# Patient Record
Sex: Female | Born: 1985 | Race: White | Hispanic: No | Marital: Married | Smoking: Never smoker
Health system: Southern US, Community
[De-identification: ages and names within clinical notes are randomized; demographics above are authoritative.]

## PROBLEM LIST (undated history)

## (undated) ENCOUNTER — Emergency Department (HOSPITAL_COMMUNITY): Payer: Managed Care, Other (non HMO)

## (undated) DIAGNOSIS — M419 Scoliosis, unspecified: Secondary | ICD-10-CM

## (undated) DIAGNOSIS — F329 Major depressive disorder, single episode, unspecified: Secondary | ICD-10-CM

## (undated) DIAGNOSIS — D649 Anemia, unspecified: Secondary | ICD-10-CM

## (undated) DIAGNOSIS — F32A Depression, unspecified: Secondary | ICD-10-CM

## (undated) HISTORY — PX: BREAST ENHANCEMENT SURGERY: SHX7

## (undated) HISTORY — PX: SPINAL GROWTH RODS: SHX2427

## (undated) HISTORY — PX: WISDOM TOOTH EXTRACTION: SHX21

---
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
## 2008-06-25 ENCOUNTER — Ambulatory Visit: Payer: Self-pay | Admitting: Internal Medicine

## 2010-11-19 LAB — HM PAP SMEAR: HM Pap smear: NORMAL

## 2011-02-24 ENCOUNTER — Ambulatory Visit (INDEPENDENT_AMBULATORY_CARE_PROVIDER_SITE_OTHER): Payer: BC Managed Care – PPO | Admitting: Internal Medicine

## 2011-02-24 ENCOUNTER — Encounter: Payer: Self-pay | Admitting: Internal Medicine

## 2011-02-24 DIAGNOSIS — Z23 Encounter for immunization: Secondary | ICD-10-CM

## 2011-02-24 DIAGNOSIS — J45909 Unspecified asthma, uncomplicated: Secondary | ICD-10-CM | POA: Insufficient documentation

## 2011-02-24 MED ORDER — FLUTICASONE-SALMETEROL 250-50 MCG/DOSE IN AEPB: 1.0000 | INHALATION_SPRAY | Freq: Two times a day (BID) | RESPIRATORY_TRACT | Status: DC

## 2011-02-24 MED ORDER — ALBUTEROL SULFATE HFA 108 (90 BASE) MCG/ACT IN AERS: 2.0000 | INHALATION_SPRAY | Freq: Four times a day (QID) | RESPIRATORY_TRACT | Status: DC | PRN

## 2011-02-24 NOTE — Progress Notes (Signed)
  Subjective:    Patient ID: Lindsey Tucker, female    DOB: 1985-07-03, 25 y.o.   MRN: [id number]  Asthma She complains of cough and wheezing. There is no chest tightness, difficulty breathing, frequent throat clearing, hemoptysis, hoarse voice, shortness of breath or sputum production. This is a chronic problem. The current episode started more than 1 year ago. The problem occurs intermittently. The problem has been unchanged. The cough is non-productive. Pertinent negatives include no appetite change, chest pain, dyspnea on exertion, ear congestion, ear pain, fever, headaches, heartburn, malaise/fatigue, myalgias, nasal congestion, orthopnea, PND, postnasal drip, rhinorrhea, sneezing, sore throat, sweats, trouble swallowing or weight loss. Her symptoms are aggravated by exercise. Her symptoms are alleviated by beta-agonist. She reports significant improvement on treatment. Her past medical history is significant for asthma.      Review of Systems  Constitutional: Negative for fever, chills, weight loss, malaise/fatigue, diaphoresis, activity change, appetite change, fatigue and unexpected weight change.  HENT: Negative.  Negative for ear pain, sore throat, hoarse voice, rhinorrhea, sneezing, trouble swallowing and postnasal drip.   Eyes: Negative.   Respiratory: Positive for cough and wheezing. Negative for apnea, hemoptysis, sputum production, choking, chest tightness, shortness of breath and stridor.   Cardiovascular: Negative for chest pain, dyspnea on exertion, palpitations, leg swelling and PND.  Gastrointestinal: Negative for heartburn, nausea, vomiting, abdominal pain, diarrhea, constipation and blood in stool.  Genitourinary: Negative for dysuria, urgency, frequency, hematuria, decreased urine volume, enuresis, difficulty urinating and dyspareunia.  Musculoskeletal: Negative for myalgias, back pain, joint swelling, arthralgias and gait problem.  Skin: Negative for color change, pallor,  rash and wound.  Neurological: Negative for dizziness, tremors, seizures, syncope, facial asymmetry, speech difficulty, weakness, light-headedness, numbness and headaches.  Hematological: Negative for adenopathy. Does not bruise/bleed easily.  Psychiatric/Behavioral: Negative.        Objective:   Physical Exam  Vitals reviewed. Constitutional: She is oriented to person, place, and time. She appears well-developed and well-nourished. No distress.  HENT:  Mouth/Throat: Oropharynx is clear and moist. No oropharyngeal exudate.  Eyes: Conjunctivae are normal. Right eye exhibits no discharge. Left eye exhibits no discharge. No scleral icterus.  Neck: Normal range of motion. Neck supple. No JVD present. No tracheal deviation present. No thyromegaly present.  Cardiovascular: Normal rate, regular rhythm, normal heart sounds and intact distal pulses.  Exam reveals no gallop and no friction rub.   No murmur heard. Pulmonary/Chest: Effort normal and breath sounds normal. No stridor. No respiratory distress. She has no wheezes. She has no rales. She exhibits no tenderness.  Abdominal: Soft. Bowel sounds are normal. She exhibits no distension and no mass. There is no tenderness. There is no rebound and no guarding.  Musculoskeletal: Normal range of motion. She exhibits no edema and no tenderness.  Lymphadenopathy:    She has no cervical adenopathy.  Neurological: She is oriented to person, place, and time. She displays normal reflexes. She exhibits normal muscle tone.  Skin: Skin is warm and dry. No rash noted. She is not diaphoretic. No erythema. No pallor.  Psychiatric: She has a normal mood and affect. Her behavior is normal. Judgment and thought content normal.          Assessment & Plan:

## 2011-02-24 NOTE — Assessment & Plan Note (Signed)
Restart advair and ventolinHFA

## 2011-02-24 NOTE — Patient Instructions (Signed)
Asthma, Adult Asthma is caused by narrowing of the air passages in the lungs. It may be triggered by pollen, dust, animal dander, molds, some foods, respiratory infections, exposure to smoke, exercise, emotional stress or other allergens (things that cause allergic reactions or allergies). Repeat attacks are common. HOME CARE INSTRUCTIONS  Use prescription medications as ordered by your caregiver.   Avoid pollen, dust, animal dander, molds, smoke and other things that cause attacks at home and at work.   You may have fewer attacks if you decrease dust in your home. Electrostatic air cleaners may help.   It may help to replace your pillows or mattress with materials less likely to cause allergies.   Talk to your caregiver about an action plan for managing asthma attacks at home, including, the use of a peak flow meter which measures the severity of your asthma attack. An action plan can help minimize or stop the attack without having to seek medical care.   If you are not on a fluid restriction, drink 8 to 10 glasses of water each day.   Always have a plan prepared for seeking medical attention, including, calling your physician, accessing local emergency care, and calling 911 (in the [place] for a severe attack.   Discuss possible exercise routines with your caregiver.   If animal dander is the cause of asthma, you may need to get rid of pets.  SEEK MEDICAL CARE IF:  You have wheezing and shortness of breath even if taking medicine to prevent attacks.   An oral temperature above 100.5 develops.   You have muscle aches, chest pain or thickening of sputum.   Your sputum changes from clear or white to yellow, green, gray or bloody.   You have any problems that may be related to the medicine you are taking (such as a rash, itching, swelling or trouble breathing).  SEEK IMMEDIATE MEDICAL CARE IF:  Your usual medicines do not stop your wheezing or there is increased coughing and/or  shortness of breath.   You have increased difficulty breathing.   You have an oral temperature above 100.5, not controlled by medicine.  MAKE SURE YOU:  Understand these instructions.   Will watch your condition.   Will get help right away if you are not doing well or get worse.  Document Released: 06/06/2005 Document Re-Released: 06/28/2009 [employer] Patient Information 2011 [employer]. 

## 2011-04-11 ENCOUNTER — Ambulatory Visit (INDEPENDENT_AMBULATORY_CARE_PROVIDER_SITE_OTHER): Payer: BC Managed Care – PPO | Admitting: Internal Medicine

## 2011-04-11 ENCOUNTER — Encounter: Payer: Self-pay | Admitting: Internal Medicine

## 2011-04-11 VITALS — BP 116/64 | HR 80 | Temp 97.5°F | Resp 16 | Wt 91.0 lb

## 2011-04-11 DIAGNOSIS — J020 Streptococcal pharyngitis: Secondary | ICD-10-CM

## 2011-04-11 MED ORDER — AMOXICILLIN 500 MG PO CAPS: 500.0000 mg | ORAL_CAPSULE | Freq: Three times a day (TID) | ORAL | Status: AC

## 2011-04-11 NOTE — Assessment & Plan Note (Signed)
Start amoxil 

## 2011-04-11 NOTE — Progress Notes (Signed)
Subjective:    Patient ID: Lindsey Tucker, female    DOB: 04/30/1986, 25 y.o.   MRN: [id number]  Sore Throat  This is a new problem. The current episode started in the past 7 days. The problem has been gradually worsening. Neither side of throat is experiencing more pain than the other. There has been no fever. The pain is at a severity of 1/10. The pain is mild. Associated symptoms include coughing and headaches. Pertinent negatives include no abdominal pain, congestion, diarrhea, drooling, ear discharge, ear pain, hoarse voice, plugged ear sensation, neck pain, shortness of breath, stridor, swollen glands, trouble swallowing or vomiting. She has had exposure to strep.  Cough Associated symptoms include chills, headaches and a sore throat. Pertinent negatives include no chest pain, ear pain, fever, myalgias, rash, shortness of breath or wheezing.  Headache  Associated symptoms include coughing and a sore throat. Pertinent negatives include no abdominal pain, back pain, dizziness, ear pain, fever, nausea, neck pain, numbness, seizures, swollen glands, vomiting or weakness.      Review of Systems  Constitutional: Positive for chills. Negative for fever, diaphoresis, activity change, appetite change, fatigue and unexpected weight change.  HENT: Positive for sore throat. Negative for ear pain, congestion, hoarse voice, facial swelling, drooling, mouth sores, trouble swallowing, neck pain, neck stiffness, voice change and ear discharge.   Eyes: Negative.   Respiratory: Positive for cough. Negative for apnea, choking, chest tightness, shortness of breath, wheezing and stridor.   Cardiovascular: Negative for chest pain, palpitations and leg swelling.  Gastrointestinal: Negative for nausea, vomiting, abdominal pain, diarrhea, constipation, abdominal distention and rectal pain.  Genitourinary: Negative for dysuria, urgency, frequency, flank pain, decreased urine volume, enuresis, difficulty urinating  and dyspareunia.  Musculoskeletal: Negative for myalgias, back pain, joint swelling, arthralgias and gait problem.  Skin: Negative for color change, pallor, rash and wound.  Neurological: Positive for headaches. Negative for dizziness, tremors, seizures, syncope, facial asymmetry, speech difficulty, weakness, light-headedness and numbness.  Hematological: Positive for adenopathy (neck).       Objective:   Physical Exam  Vitals reviewed. Constitutional: She is oriented to person, place, and time. She appears well-developed and well-nourished. No distress.  HENT:  Head: Normocephalic and atraumatic. No trismus in the jaw.  Right Ear: Hearing, tympanic membrane, external ear and ear canal normal.  Left Ear: Hearing, external ear and ear canal normal.  Mouth/Throat: Uvula is midline and mucous membranes are normal. Mucous membranes are not pale, not dry and not cyanotic. No uvula swelling. Posterior oropharyngeal erythema present. No oropharyngeal exudate, posterior oropharyngeal edema or tonsillar abscesses.  Eyes: Conjunctivae are normal. Right eye exhibits no discharge. Left eye exhibits no discharge. No scleral icterus.  Neck: Normal range of motion. Neck supple. No JVD present. No tracheal deviation present. No thyromegaly present.  Cardiovascular: Normal rate, regular rhythm, normal heart sounds and intact distal pulses.  Exam reveals no gallop and no friction rub.   No murmur heard. Pulmonary/Chest: Effort normal and breath sounds normal. No stridor. No respiratory distress. She has no wheezes. She has no rales. She exhibits no tenderness.  Abdominal: Soft. Bowel sounds are normal. She exhibits no distension and no mass. There is no tenderness. There is no rebound and no guarding.  Musculoskeletal: Normal range of motion. She exhibits no edema and no tenderness.  Lymphadenopathy:    She has no cervical adenopathy.  Neurological: She is oriented to person, place, and time. She displays  normal reflexes. She exhibits normal muscle tone. Coordination normal.  Skin: Skin is warm and dry. No rash noted. She is not diaphoretic. No erythema. No pallor.  Psychiatric: She has a normal mood and affect. Her behavior is normal. Judgment and thought content normal.          Assessment & Plan:

## 2011-04-11 NOTE — Patient Instructions (Signed)
Strep Throat     Strep throat is an infection of the throat caused by a bacteria named Streptococcus pyogenes. Your caregiver may call the infection streptococcal "tonsillitis" or "pharyngitis" depending on whether there are signs of inflammation in the tonsils or back of the throat. Strep throat is most common in children from 5 to 25 years old during the cold months of the year, but it can occur in people of any age during any season. This infection is spread from person to person (contagious) through coughing, sneezing, or other close contact.  SYMPTOMS   · Fever or chills.   · Painful, swollen, red tonsils or throat.   · Pain or difficulty when swallowing.   · White or yellow spots on the tonsils or throat.   · Swollen, tender lymph nodes or "glands" of the neck or under the jaw.   · Red rash all over the body (rare).   DIAGNOSIS   Many different infections can cause the same symptoms. A test must be done to confirm the diagnosis so the right treatment can be given. A "rapid strep test" can help your caregiver make the diagnosis in a few minutes. If this test is not available, a light swab of the infected area can be used for a throat culture test. If a throat culture test is done, results are usually available in a day or two.  TREATMENT   Strep throat is treated with antibiotic medicine.  HOME CARE INSTRUCTIONS   · Gargle with 1 tsp of salt in 1 cup of warm water, 3 to 4 times per day or as needed for comfort.   · Family members who also have a sore throat or fever should be tested for strep throat and treated with antibiotics if they have the strep infection.   · Make sure everyone in your household washes their hands well.   · Do not share food, drinking cups, or personal items that could cause the infection to spread to others.   · You may need to eat a soft food diet until your sore throat gets better.   · Drink enough water and fluids to keep your urine clear or pale yellow. This will help prevent  dehydration.   · Get plenty of rest.   · Stay home from school, daycare, or work until you have been on antibiotics for 24 hours.   · Only take over-the-counter or prescription medicines for pain, discomfort, or fever as directed by your caregiver.   · If antibiotics are prescribed, take them as directed. Finish them even if you start to feel better.   SEEK MEDICAL CARE IF:   · The glands in your neck continue to enlarge.   · You develop a rash, cough, or earache.   · You cough up green, yellow-brown, or bloody sputum.   · You have pain or discomfort not controlled by medicines.   · Your problems seem to be getting worse rather than better.   SEEK IMMEDIATE MEDICAL CARE IF:   · You develop any new symptoms such as vomiting, severe headache, stiff or painful neck, chest pain, shortness of breath, or trouble swallowing.   · You develop severe throat pain, drooling, or changes in your voice.   · You develop swelling of the neck, or the skin on the neck becomes red and tender.   · You have a fever.   · You develop signs of dehydration, such as fatigue, dry mouth, and decreased urination.   · 

## 2011-06-09 ENCOUNTER — Ambulatory Visit (INDEPENDENT_AMBULATORY_CARE_PROVIDER_SITE_OTHER): Payer: BC Managed Care – PPO | Admitting: Internal Medicine

## 2011-06-09 ENCOUNTER — Encounter: Payer: Self-pay | Admitting: Internal Medicine

## 2011-06-09 VITALS — BP 100/62 | HR 88 | Temp 98.2°F | Ht 60.0 in | Wt 89.0 lb

## 2011-06-09 DIAGNOSIS — J45901 Unspecified asthma with (acute) exacerbation: Secondary | ICD-10-CM | POA: Insufficient documentation

## 2011-06-09 DIAGNOSIS — J209 Acute bronchitis, unspecified: Secondary | ICD-10-CM

## 2011-06-09 MED ORDER — AZITHROMYCIN 250 MG PO TABS: ORAL_TABLET | ORAL | Status: AC

## 2011-06-09 MED ORDER — PREDNISONE 10 MG PO TABS: ORAL_TABLET | ORAL | Status: DC

## 2011-06-09 MED ORDER — HYDROCODONE-HOMATROPINE 5-1.5 MG/5ML PO SYRP: 5.0000 mL | ORAL_SOLUTION | Freq: Four times a day (QID) | ORAL | Status: AC | PRN

## 2011-06-09 NOTE — Patient Instructions (Signed)
Take all new medications as prescribed Continue all other medications as before You can also take 1-2 puffs of the pulmicort twice per day to help with the wheezing if you do not want to take the prednisone

## 2011-06-09 NOTE — Assessment & Plan Note (Signed)
Mild, for predpack asd though she is reluctant, so as an alternative gave pulmicort flexhaler sample to use at 2 puffs bid, Continue all other medications as before

## 2011-06-09 NOTE — Progress Notes (Signed)
  Subjective:    Patient ID: Lindsey Tucker, female    DOB: 15-May-1986, 25 y.o.   MRN: [id number]  HPI  Here with acute onset mild to mod 2-3 days ST, HA, general weakness and malaise, with prod cough greenish sputum, but Pt denies chest pain, increased sob or doe, wheezing, orthopnea, PND, increased LE swelling, palpitations, dizziness or syncope, until mild wheezing onset today with mild sob, but improved with inhaler.   Past Medical History  Diagnosis Date  . Asthma    No past surgical history on file.  reports that she has never smoked. She has never used smokeless tobacco. She reports that she drinks about 1.2 ounces of alcohol per week. She reports that she does not use illicit drugs. family history includes Hypertension in her father and mother.  There is no history of Cancer. No Known Allergies Current Outpatient Prescriptions on File Prior to Visit  Medication Sig Dispense Refill  . albuterol (PROVENTIL HFA;VENTOLIN HFA) 108 (90 BASE) MCG/ACT inhaler Inhale 2 puffs into the lungs every 6 (six) hours as needed for wheezing.  2 Inhaler  0  . Fluticasone-Salmeterol (ADVAIR DISKUS) 250-50 MCG/DOSE AEPB Inhale 1 puff into the lungs 2 (two) times daily.  60 each  11   Review of Systems All otherwise neg per pt     Objective:   Physical Exam BP 100/62  Pulse 88  Temp(Src) 98.2 F (36.8 C) (Oral)  Ht 5' (1.524 m)  Wt 89 lb (40.37 kg)  BMI 17.38 kg/m2  SpO2 97%  LMP 06/05/2011 Physical Exam  VS noted, mild ill Constitutional: Pt appears well-developed and well-nourished.  HENT: Head: Normocephalic.  Right Ear: External ear normal.  Left Ear: External ear normal.  Bilat tm's mild erythema.  Sinus nontender.  Pharynx mild erythema Eyes: Conjunctivae and EOM are normal. Pupils are equal, round, and reactive to light.  Neck: Normal range of motion. Neck supple.  Cardiovascular: Normal rate and regular rhythm.   Pulmonary/Chest: Effort normal and breath sounds decresed with bilat  mild wheeze, no rales  Skin: Skin is warm. No erythema.  Psychiatric: Pt behavior is normal. Thought content normal.     Assessment & Plan:

## 2011-06-09 NOTE — Assessment & Plan Note (Signed)
Mild to mod, for antibx course,  to f/u any worsening symptoms or concerns 

## 2011-10-14 ENCOUNTER — Encounter: Payer: Self-pay | Admitting: Internal Medicine

## 2011-10-14 ENCOUNTER — Ambulatory Visit (INDEPENDENT_AMBULATORY_CARE_PROVIDER_SITE_OTHER): Payer: BC Managed Care – PPO | Admitting: Internal Medicine

## 2011-10-14 ENCOUNTER — Telehealth: Payer: Self-pay

## 2011-10-14 VITALS — BP 118/74 | HR 102 | Temp 97.2°F | Resp 16 | Wt 95.0 lb

## 2011-10-14 DIAGNOSIS — J069 Acute upper respiratory infection, unspecified: Secondary | ICD-10-CM

## 2011-10-14 DIAGNOSIS — J45901 Unspecified asthma with (acute) exacerbation: Secondary | ICD-10-CM

## 2011-10-14 DIAGNOSIS — B9689 Other specified bacterial agents as the cause of diseases classified elsewhere: Secondary | ICD-10-CM | POA: Insufficient documentation

## 2011-10-14 MED ORDER — DEXAMETHASONE 1.5 MG PO KIT: 1.0000 | PACK | Freq: Every day | ORAL | Status: DC

## 2011-10-14 MED ORDER — METHYLPREDNISOLONE 4 MG PO KIT: PACK | ORAL | Status: AC

## 2011-10-14 NOTE — Assessment & Plan Note (Signed)
She will continue the current inhalers and will start a steroid pak

## 2011-10-14 NOTE — Assessment & Plan Note (Signed)
She has no s/s that are suggestive of a bacterial infection so no antibiotics were prescribed

## 2011-10-14 NOTE — Telephone Encounter (Signed)
Lindsey Tucker with [employer] called [facility] stating that dex pk is not available and pt would like alternative.

## 2011-10-14 NOTE — Progress Notes (Signed)
Subjective:    Patient ID: Lindsey Tucker, female    DOB: 07-17-1985, 26 y.o.   MRN: [id number]  URI  This is a new problem. The current episode started yesterday. The problem has been unchanged. There has been no fever. Associated symptoms include coughing (non-productive), a sore throat and wheezing. Pertinent negatives include no abdominal pain, chest pain, congestion, diarrhea, dysuria, ear pain, headaches, joint pain, joint swelling, nausea, neck pain, plugged ear sensation, rash, rhinorrhea, sinus pain, sneezing, swollen glands or vomiting. She has tried nothing for the symptoms.  Asthma She complains of chest tightness, cough (non-productive) and wheezing. There is no difficulty breathing, frequent throat clearing, hemoptysis, hoarse voice, shortness of breath or sputum production. This is a chronic problem. The current episode started more than 1 year ago. The problem occurs intermittently. The problem has been unchanged. The cough is non-productive. Associated symptoms include a sore throat. Pertinent negatives include no appetite change, chest pain, dyspnea on exertion, ear congestion, ear pain, fever, headaches, heartburn, malaise/fatigue, myalgias, nasal congestion, orthopnea, PND, postnasal drip, rhinorrhea, sneezing, sweats, trouble swallowing or weight loss. Her symptoms are aggravated by URI. Her symptoms are alleviated by beta-agonist and steroid inhaler. She reports minimal improvement on treatment. Her past medical history is significant for asthma.      Review of Systems  Constitutional: Negative for fever, chills, weight loss, malaise/fatigue, diaphoresis, activity change, appetite change, fatigue and unexpected weight change.  HENT: Positive for sore throat. Negative for ear pain, congestion, hoarse voice, rhinorrhea, sneezing, trouble swallowing, neck pain and postnasal drip.   Eyes: Negative.   Respiratory: Positive for cough (non-productive) and wheezing. Negative for apnea,  hemoptysis, sputum production, choking, chest tightness, shortness of breath and stridor.   Cardiovascular: Negative for chest pain, dyspnea on exertion, palpitations, leg swelling and PND.  Gastrointestinal: Negative.  Negative for heartburn, nausea, vomiting, abdominal pain and diarrhea.  Genitourinary: Negative.  Negative for dysuria.  Musculoskeletal: Negative.  Negative for myalgias and joint pain.  Skin: Negative.  Negative for rash.  Neurological: Negative.  Negative for headaches.  Hematological: Negative for adenopathy. Does not bruise/bleed easily.  Psychiatric/Behavioral: Negative.        Objective:   Physical Exam  Vitals reviewed. Constitutional: She is oriented to person, place, and time. She appears well-developed and well-nourished. No distress.  HENT:  Head: Normocephalic and atraumatic.  Mouth/Throat: Oropharynx is clear and moist. No oropharyngeal exudate.  Eyes: Conjunctivae are normal. Right eye exhibits no discharge. Left eye exhibits no discharge. No scleral icterus.  Neck: Normal range of motion. Neck supple. No JVD present. No tracheal deviation present. No thyromegaly present.  Cardiovascular: Normal rate, regular rhythm, normal heart sounds and intact distal pulses.  Exam reveals no gallop and no friction rub.   No murmur heard. Pulmonary/Chest: Effort normal. No accessory muscle usage or stridor. Not tachypneic. No respiratory distress. She has no decreased breath sounds. She has wheezes in the right middle field and the left middle field. She has rhonchi in the right middle field and the left middle field. She has no rales. She exhibits no tenderness.  Abdominal: Soft. Bowel sounds are normal. She exhibits no distension and no mass. There is no tenderness. There is no rebound and no guarding.  Musculoskeletal: Normal range of motion. She exhibits no edema and no tenderness.  Lymphadenopathy:    She has no cervical adenopathy.  Neurological: She is oriented to  person, place, and time.  Skin: Skin is warm and dry. No rash noted. She  is not diaphoretic. No erythema. No pallor.  Psychiatric: She has a normal mood and affect. Her behavior is normal. Judgment and thought content normal.          Assessment & Plan:

## 2011-10-14 NOTE — Patient Instructions (Signed)
Upper Respiratory Infection, Adult  An upper respiratory infection (URI) is also known as the common cold. It is often caused by a type of germ (virus). Colds are easily spread (contagious). You can pass it to others by kissing, coughing, sneezing, or drinking out of the same glass. Usually, you get better in 1 or 2 weeks.   HOME CARE    Only take medicine as told by your doctor.   Use a warm mist humidifier or breathe in steam from a hot shower.   Drink enough water and fluids to keep your pee (urine) clear or pale yellow.   Get plenty of rest.   Return to work when your temperature is back to normal or as told by your doctor. You may use a face mask and wash your hands to stop your cold from spreading.  GET HELP RIGHT AWAY IF:    After the first few days, you feel you are getting worse.   You have questions about your medicine.   You have chills, shortness of breath, or brown or red spit (mucus).   You have yellow or brown snot (nasal discharge) or pain in the face, especially when you bend forward.   You have a fever, puffy (swollen) neck, pain when you swallow, or white spots in the back of your throat.   You have a bad headache, ear pain, sinus pain, or chest pain.   You have a high-pitched whistling sound when you breathe in and out (wheezing).   You have a lasting cough or cough up blood.   You have sore muscles or a stiff neck.  MAKE SURE YOU:    Understand these instructions.   Will watch your condition.   Will get help right away if you are not doing well or get worse.  Document Released: 11/23/2007 Document Revised: 05/26/2011 Document Reviewed: 10/11/2010  Exit[employer] Patient Information 2012 [employer]  Asthma, Adult  Asthma is caused by narrowing of the air passages in the lungs. It may be triggered by pollen, dust, animal dander, molds, some foods, respiratory infections, exposure to smoke, exercise, emotional stress or other allergens (things that cause allergic reactions or  allergies). Repeat attacks are common.  HOME CARE INSTRUCTIONS    Use prescription medications as ordered by your caregiver.   Avoid pollen, dust, animal dander, molds, smoke and other things that cause attacks at home and at work.   You may have fewer attacks if you decrease dust in your home. Electrostatic air cleaners may help.   It may help to replace your pillows or mattress with materials less likely to cause allergies.   Talk to your caregiver about an action plan for managing asthma attacks at home, including, the use of a peak flow meter which measures the severity of your asthma attack. An action plan can help minimize or stop the attack without having to seek medical care.   If you are not on a fluid restriction, drink 8 to 10 glasses of water each day.   Always have a plan prepared for seeking medical attention, including, calling your physician, accessing local emergency care, and calling 911 (in the [place] for a severe attack.   Discuss possible exercise routines with your caregiver.   If animal dander is the cause of asthma, you may need to get rid of pets.  SEEK MEDICAL CARE IF:    You have wheezing and shortness of breath even if taking medicine to prevent attacks.   You have muscle   aches, chest pain or thickening of sputum.   Your sputum changes from clear or white to yellow, green, gray, or bloody.   You have any problems that may be related to the medicine you are taking (such as a rash, itching, swelling or trouble breathing).  SEEK IMMEDIATE MEDICAL CARE IF:    Your usual medicines do not stop your wheezing or there is increased coughing and/or shortness of breath.   You have increased difficulty breathing.   You have a fever.  MAKE SURE YOU:    Understand these instructions.   Will watch your condition.   Will get help right away if you are not doing well or get worse.  Document Released: 06/06/2005 Document Revised: 05/26/2011 Document Reviewed: 01/23/2008  [employer] Patient  Information 2012 [employer].

## 2012-06-20 NOTE — L&D Delivery Note (Signed)
Operative Delivery Note At 10:14 PM a viable and healthy female was delivered via Vaginal, [employer], operational).  Presentation: vertex; Position: Right,, Occiput,, Anterior; Station: +4.  Verbal consent: obtained from patient.  Risks and benefits discussed in detail.  Risks include, but are not limited to the risks of anesthesia, bleeding, infection, damage to maternal tissues, fetal cephalhematoma.  There is also the risk of inability to effect vaginal delivery of the head, or shoulder dystocia that cannot be resolved by established maneuvers, leading to the need for emergency cesarean section.  APGAR: 9,9 ; weight pending.   Placenta status: Intact, Spontaneous.   Cord: 2 vessels with the following complications: .  Cord pH: na  Anesthesia: Epidural  Instruments: Kiwi x 2 pulls, one pop off Episiotomy: None Lacerations: second Suture Repair: 2.0 3.0 vicryl rapide Est. Blood Loss (mL): 300  Mom to postpartum.  Baby to Couplet care / Skin to Skin.  Keitra Carusone J 05/31/2013, 10:31 PM

## 2012-07-16 ENCOUNTER — Ambulatory Visit (INDEPENDENT_AMBULATORY_CARE_PROVIDER_SITE_OTHER): Payer: BC Managed Care – PPO | Admitting: Internal Medicine

## 2012-07-16 ENCOUNTER — Encounter: Payer: Self-pay | Admitting: Internal Medicine

## 2012-07-16 VITALS — BP 100/66 | HR 84 | Temp 98.4°F | Resp 18 | Wt 95.0 lb

## 2012-07-16 DIAGNOSIS — B9689 Other specified bacterial agents as the cause of diseases classified elsewhere: Secondary | ICD-10-CM

## 2012-07-16 DIAGNOSIS — J329 Chronic sinusitis, unspecified: Secondary | ICD-10-CM

## 2012-07-16 DIAGNOSIS — A499 Bacterial infection, unspecified: Secondary | ICD-10-CM

## 2012-07-16 MED ORDER — AMOXICILLIN 875 MG PO TABS: 875.0000 mg | ORAL_TABLET | Freq: Two times a day (BID) | ORAL | Status: DC

## 2012-07-16 NOTE — Progress Notes (Signed)
Subjective:    Patient ID: Lindsey Tucker, female    DOB: 1986/06/08, 27 y.o.   MRN: [id number]  Sinusitis This is a new problem. The current episode started 1 to 4 weeks ago. The problem has been gradually worsening since onset. There has been no fever. The fever has been present for less than 1 day. Her pain is at a severity of 0/10. She is experiencing no pain. Associated symptoms include congestion, coughing (NP), headaches, sinus pressure and a sore throat. Pertinent negatives include no chills, diaphoresis, ear pain, hoarse voice, neck pain, shortness of breath, sneezing or swollen glands. Past treatments include oral decongestants. The treatment provided mild relief.      Review of Systems  Constitutional: Negative for fever, chills, diaphoresis, appetite change, fatigue and unexpected weight change.  HENT: Positive for congestion, sore throat, rhinorrhea and sinus pressure. Negative for ear pain, hoarse voice, facial swelling, sneezing, trouble swallowing, neck pain, neck stiffness, dental problem, voice change, postnasal drip and ear discharge.   Eyes: Negative.   Respiratory: Positive for cough (NP). Negative for apnea, choking, chest tightness, shortness of breath, wheezing and stridor.   Cardiovascular: Negative for chest pain, palpitations and leg swelling.  Gastrointestinal: Negative for nausea, vomiting, abdominal pain, diarrhea, constipation and blood in stool.  Genitourinary: Negative.   Musculoskeletal: Negative for myalgias, back pain, joint swelling, arthralgias and gait problem.  Skin: Negative for color change, pallor, rash and wound.  Neurological: Positive for headaches. Negative for dizziness, weakness and light-headedness.  Hematological: Negative for adenopathy. Does not bruise/bleed easily.  Psychiatric/Behavioral: Negative.        Objective:   Physical Exam  Vitals reviewed. Constitutional: She is oriented to person, place, and time. She appears  well-developed and well-nourished.  Non-toxic appearance. She does not have a sickly appearance. She does not appear ill. No distress.  HENT:  Head: Normocephalic and atraumatic. No trismus in the jaw.  Right Ear: Hearing, tympanic membrane, external ear and ear canal normal.  Left Ear: Hearing, tympanic membrane, external ear and ear canal normal.  Nose: Mucosal edema and rhinorrhea present. No sinus tenderness. No epistaxis. Right sinus exhibits maxillary sinus tenderness. Right sinus exhibits no frontal sinus tenderness. Left sinus exhibits maxillary sinus tenderness. Left sinus exhibits no frontal sinus tenderness.  Mouth/Throat: Oropharynx is clear and moist and mucous membranes are normal. Mucous membranes are not pale, not dry and not cyanotic. No oral lesions. No uvula swelling. No oropharyngeal exudate, posterior oropharyngeal edema or tonsillar abscesses.  Eyes: Conjunctivae normal are normal. Right eye exhibits no discharge. Left eye exhibits no discharge. No scleral icterus.  Neck: Normal range of motion. Neck supple. No JVD present. No tracheal deviation present. No thyromegaly present.  Cardiovascular: Normal rate, regular rhythm, normal heart sounds and intact distal pulses.  Exam reveals no gallop and no friction rub.   No murmur heard. Pulmonary/Chest: Effort normal and breath sounds normal. No stridor. No respiratory distress. She has no wheezes. She has no rales. She exhibits no tenderness.  Abdominal: Soft. Bowel sounds are normal. She exhibits no distension and no mass. There is no tenderness. There is no rebound and no guarding.  Musculoskeletal: Normal range of motion. She exhibits no edema and no tenderness.  Lymphadenopathy:    She has no cervical adenopathy.  Neurological: She is oriented to person, place, and time.  Skin: Skin is warm and dry. No rash noted. She is not diaphoretic. No erythema. No pallor.  Psychiatric: She has a normal mood and affect.  Her behavior is  normal. Judgment and thought content normal.          Assessment & Plan:

## 2012-07-16 NOTE — Patient Instructions (Signed)

## 2012-07-16 NOTE — Assessment & Plan Note (Signed)
Will start amoxil for the infection

## 2012-09-05 ENCOUNTER — Other Ambulatory Visit: Payer: BC Managed Care – PPO

## 2012-09-05 ENCOUNTER — Ambulatory Visit (INDEPENDENT_AMBULATORY_CARE_PROVIDER_SITE_OTHER): Payer: BC Managed Care – PPO | Admitting: Internal Medicine

## 2012-09-05 ENCOUNTER — Encounter: Payer: Self-pay | Admitting: Internal Medicine

## 2012-09-05 VITALS — BP 110/72 | HR 136 | Temp 100.2°F

## 2012-09-05 DIAGNOSIS — R509 Fever, unspecified: Secondary | ICD-10-CM

## 2012-09-05 DIAGNOSIS — J029 Acute pharyngitis, unspecified: Secondary | ICD-10-CM

## 2012-09-05 DIAGNOSIS — R590 Localized enlarged lymph nodes: Secondary | ICD-10-CM

## 2012-09-05 DIAGNOSIS — R599 Enlarged lymph nodes, unspecified: Secondary | ICD-10-CM

## 2012-09-05 MED ORDER — ACETAMINOPHEN 500 MG PO TABS: 500.0000 mg | ORAL_TABLET | Freq: Four times a day (QID) | ORAL | Status: DC | PRN

## 2012-09-05 NOTE — Patient Instructions (Signed)
It was good to see you today. We have reviewed your prior records including labs and tests today Rapid strep test today negative Will send for throat culture to confirm these results Also check blood for possible mono - Test(s) ordered today. Your results will be released to MyChart (or called to you) after review, usually within 72hours after test completion. If any changes need to be made, you will be notified at that same time. Until results return, no indication for antibiotics at this time Use Tylenol 500 mg every 6 hours as needed for fever or pain symptoms Salt water gargle as needed for sore throat, see directions below Call if symptoms unimproved in next 2 weeks, sooner if worse Viral and Bacterial Pharyngitis Pharyngitis is soreness (inflammation) or infection of the pharynx. It is also called a sore throat. CAUSES  Most sore throats are caused by viruses and are part of a cold. However, some sore throats are caused by strep and other bacteria. Sore throats can also be caused by post nasal drip from draining sinuses, allergies and sometimes from sleeping with an open mouth. Infectious sore throats can be spread from person to person by coughing, sneezing and sharing cups or eating utensils. TREATMENT  Sore throats that are viral usually last 3-4 days. Viral illness will get better without medications (antibiotics). Strep throat and other bacterial infections will usually begin to get better about 24-48 hours after you begin to take antibiotics. HOME CARE INSTRUCTIONS   If the caregiver feels there is a bacterial infection or if there is a positive strep test, they will prescribe an antibiotic. The full course of antibiotics must be taken. If the full course of antibiotic is not taken, you or your child may become ill again. If you or your child has strep throat and do not finish all of the medication, serious heart or kidney diseases may develop.  Drink enough water and fluids to keep  your urine clear or pale yellow.  Only take over-the-counter or prescription medicines for pain, discomfort or fever as directed by your caregiver.  Get lots of rest.  Gargle with salt water ( tsp. of salt in a glass of water) as often as every 1-2 hours as you need for comfort.  Hard candies may soothe the throat if individual is not at risk for choking. Throat sprays or lozenges may also be used. SEEK MEDICAL CARE IF:   Large, tender lumps in the neck develop.  A rash develops.  Green, yellow-brown or bloody sputum is coughed up.  Your baby is older than 3 months with a rectal temperature of 100.5 F (38.1 C) or higher for more than 1 day. SEEK IMMEDIATE MEDICAL CARE IF:   A stiff neck develops.  You or your child are drooling or unable to swallow liquids.  You or your child are vomiting, unable to keep medications or liquids down.  You or your child has severe pain, unrelieved with recommended medications.  You or your child are having difficulty breathing (not due to stuffy nose).  You or your child are unable to fully open your mouth.  You or your child develop redness, swelling, or severe pain anywhere on the neck.  You have a fever.  Your baby is older than 3 months with a rectal temperature of 102 F (38.9 C) or higher.  Your baby is 82 months old or younger with a rectal temperature of 100.4 F (38 C) or higher. MAKE SURE YOU:   Understand these  instructions.  Will watch your condition.  Will get help right away if you are not doing well or get worse. Document Released: 06/06/2005 Document Revised: 08/29/2011 Document Reviewed: 09/03/2007 [facility] Patient Information 2013 [place], [place]. Salt Water Gargle This solution will help make your mouth and throat feel better. HOME CARE INSTRUCTIONS   Mix 1 teaspoon of salt in 8 ounces of warm water.  Gargle with this solution as much or often as you need or as directed. Swish and gargle gently if you have  any sores or wounds in your mouth.  Do not swallow this mixture. Document Released: 03/10/2004 Document Revised: 08/29/2011 Document Reviewed: 08/01/2008 [facility] Patient Information 2013 [place], [place].

## 2012-09-05 NOTE — Progress Notes (Signed)
  Subjective:    Patient ID: Lindsey Tucker, female    DOB: [birth year], 27 y.o.   MRN: [id number]  Sore Throat  This is a new problem. The current episode started today. The problem has been rapidly worsening. The pain is worse on the right side. The maximum temperature recorded prior to her arrival was 101 - 101.9 F. The fever has been present for less than 1 day. The pain is moderate. Associated symptoms include trouble swallowing. Pertinent negatives include no abdominal pain, coughing, diarrhea, drooling, ear discharge, ear pain, headaches, hoarse voice, plugged ear sensation, shortness of breath, stridor, swollen glands or vomiting. She has tried nothing for the symptoms.  Fever  Pertinent negatives include no abdominal pain, coughing, diarrhea, ear pain, headaches or vomiting.   Past Medical History  Diagnosis Date  . Asthma      Review of Systems  Constitutional: Positive for fever.  HENT: Positive for trouble swallowing. Negative for ear pain, hoarse voice, drooling and ear discharge.   Respiratory: Negative for cough, shortness of breath and stridor.   Gastrointestinal: Negative for vomiting, abdominal pain and diarrhea.  Neurological: Negative for headaches.       Objective:   Physical Exam BP 110/72  Pulse 136  Temp(Src) 100.2 F (37.9 C) (Oral)  SpO2 98% Wt Readings from Last 3 Encounters:  07/16/12 95 lb (43.092 kg)  10/14/11 95 lb (43.092 kg)  06/09/11 89 lb (40.37 kg)   Constitutional: She appears well-developed and well-nourished. No distress. Tired but nontoxic HENT: Head: Normocephalic and atraumatic. Ears: B TMs ok, no erythema or effusion; Nose: Nose normal. Mouth/Throat: Oropharynx is red but clear and moist. No oropharyngeal exudate.  Eyes: Conjunctivae and EOM are normal. Pupils are equal, round, and reactive to light. No scleral icterus.  Neck: Normal range of motion. Neck supple. +LAD on R. No JVD present. No thyromegaly present.  Cardiovascular: Normal  rate, regular rhythm and normal heart sounds.  No murmur heard. No BLE edema. Pulmonary/Chest: Effort normal and breath sounds normal. No respiratory distress. She has no wheezes. Skin: Skin is warm and dry. No rash noted. No erythema.  Psychiatric: She has a normal mood and affect. Her behavior is normal. Judgment and thought content normal.   No results found for this basename: WBC, HGB, HCT, PLT, GLUCOSE, CHOL, TRIG, HDL, LDLDIRECT, LDLCALC, ALT, AST, NA, K, CL, CREATININE, BUN, CO2, TSH, PSA, INR, GLUF, HGBA1C, MICROALBUR       Assessment & Plan:   Acute pharyngitis -  Associated with fever, cervical lymphadenopathy and malaise Asthma, stable without flare Also reports in process of trying to become pregnant  Check rapid strep today and sent for culture to confirm/deny: Check labs for acute mono Hold empiric antibiotics unless positive culture for strep Symptomatic care advised: saline gargle and Tylenol when necessary

## 2012-09-06 ENCOUNTER — Other Ambulatory Visit: Payer: Self-pay | Admitting: Internal Medicine

## 2012-09-06 ENCOUNTER — Telehealth: Payer: Self-pay | Admitting: *Deleted

## 2012-09-06 ENCOUNTER — Encounter: Payer: Self-pay | Admitting: Internal Medicine

## 2012-09-06 LAB — EPSTEIN-BARR VIRUS VCA ANTIBODY PANEL: EBV EA IgG: <5 U/mL (ref <9.0)

## 2012-09-06 MED ORDER — AMOXICILLIN 500 MG PO CAPS: 500.0000 mg | ORAL_CAPSULE | Freq: Three times a day (TID) | ORAL | Status: DC

## 2012-09-06 NOTE — Telephone Encounter (Signed)
The results of culture are not back yet - this usually takes 48-72h - However, it is ok to start amoxicillin antibiotics pending these results: amox 500 TID x 10d - erx done (safe in pregnancy, will call to stop if cx negative) Continue tylenol 500mg  q6h - thanks

## 2012-09-06 NOTE — Telephone Encounter (Signed)
Pt requesting results of lab work and throat culture from yesterday, she states that her swelling in lymph notes and throat pain has increased and now she has white spots on tonsils.

## 2012-09-07 NOTE — Telephone Encounter (Signed)
Pt informed of new rx and MD's advisement.  

## 2012-09-08 LAB — CULTURE, GROUP A STREP

## 2013-04-25 ENCOUNTER — Other Ambulatory Visit: Payer: Self-pay

## 2013-05-03 ENCOUNTER — Ambulatory Visit (INDEPENDENT_AMBULATORY_CARE_PROVIDER_SITE_OTHER): Payer: BC Managed Care – PPO | Admitting: Internal Medicine

## 2013-05-03 ENCOUNTER — Ambulatory Visit: Payer: BC Managed Care – PPO | Admitting: Internal Medicine

## 2013-05-03 ENCOUNTER — Encounter: Payer: Self-pay | Admitting: Internal Medicine

## 2013-05-03 VITALS — BP 118/80 | HR 94 | Temp 98.6°F | Resp 16 | Wt 119.0 lb

## 2013-05-03 DIAGNOSIS — J069 Acute upper respiratory infection, unspecified: Secondary | ICD-10-CM | POA: Insufficient documentation

## 2013-05-03 NOTE — Progress Notes (Signed)
Pre-visit discussion using our clinic review tool. No additional management support is needed unless otherwise documented below in the visit note.  

## 2013-05-03 NOTE — Assessment & Plan Note (Signed)
This is a viral URI Antibiotics are not indicated She will treat with OTC symptom relievers

## 2013-05-03 NOTE — Progress Notes (Signed)
Subjective:    Patient ID: Lindsey Tucker, female    DOB: 04/18/1986, 27 y.o.   MRN: [id number]  URI  This is a new problem. The current episode started in the past 7 days. The problem has been unchanged. There has been no fever. The fever has been present for less than 1 day. Associated symptoms include coughing (nonproductive) and a sore throat. Pertinent negatives include no abdominal pain, chest pain, congestion, diarrhea, dysuria, ear pain, headaches, joint pain, joint swelling, nausea, neck pain, plugged ear sensation, rash, rhinorrhea, sinus pain, sneezing, swollen glands, vomiting or wheezing. She has tried nothing for the symptoms. The treatment provided mild relief.      Review of Systems  Constitutional: Negative.  Negative for fever, chills, diaphoresis, activity change, appetite change, fatigue and unexpected weight change.  HENT: Positive for sore throat. Negative for congestion, ear pain, nosebleeds, postnasal drip, rhinorrhea, sneezing, tinnitus, trouble swallowing and voice change.   Eyes: Negative.   Respiratory: Positive for cough (nonproductive). Negative for apnea, choking, chest tightness, shortness of breath, wheezing and stridor.   Cardiovascular: Negative.  Negative for chest pain, palpitations and leg swelling.  Gastrointestinal: Negative.  Negative for nausea, vomiting, abdominal pain and diarrhea.  Endocrine: Negative.   Genitourinary: Negative.  Negative for dysuria.  Musculoskeletal: Negative.  Negative for joint pain and neck pain.  Skin: Negative.  Negative for color change, pallor, rash and wound.  Allergic/Immunologic: Negative.   Neurological: Negative.  Negative for headaches.  Hematological: Negative.  Negative for adenopathy. Does not bruise/bleed easily.  Psychiatric/Behavioral: Negative.        Objective:   Physical Exam  Vitals reviewed. Constitutional: She is oriented to person, place, and time. She appears well-developed and well-nourished.   Non-toxic appearance. She does not have a sickly appearance. She does not appear ill. No distress.  HENT:  Head: Normocephalic and atraumatic.  Right Ear: Hearing, tympanic membrane, external ear and ear canal normal.  Left Ear: Hearing, tympanic membrane, external ear and ear canal normal.  Nose: Right sinus exhibits no maxillary sinus tenderness and no frontal sinus tenderness. Left sinus exhibits no maxillary sinus tenderness and no frontal sinus tenderness.  Mouth/Throat: Oropharynx is clear and moist and mucous membranes are normal. Mucous membranes are not pale, not dry and not cyanotic. No oral lesions. No trismus in the jaw. No uvula swelling. No oropharyngeal exudate, posterior oropharyngeal edema, posterior oropharyngeal erythema or tonsillar abscesses.  Eyes: Conjunctivae are normal. Right eye exhibits no discharge. Left eye exhibits no discharge. No scleral icterus.  Neck: Normal range of motion. Neck supple. No JVD present. No tracheal deviation present. No thyromegaly present.  Cardiovascular: Normal rate, regular rhythm, normal heart sounds and intact distal pulses.  Exam reveals no gallop and no friction rub.   No murmur heard. Pulmonary/Chest: Effort normal and breath sounds normal. No stridor. No respiratory distress. She has no wheezes. She has no rales. She exhibits no tenderness.  Abdominal: Soft. Bowel sounds are normal. She exhibits no distension and no mass. There is no tenderness. There is no rebound and no guarding.  Musculoskeletal: Normal range of motion. She exhibits no edema and no tenderness.  Lymphadenopathy:    She has no cervical adenopathy.  Neurological: She is oriented to person, place, and time.  Skin: Skin is warm and dry. No rash noted. She is not diaphoretic. No erythema. No pallor.  Psychiatric: She has a normal mood and affect. Her behavior is normal. Judgment and thought content normal.  No results found for this basename: WBC, HGB, HCT, PLT,  GLUCOSE, CHOL, TRIG, HDL, LDLDIRECT, LDLCALC, ALT, AST, NA, K, CL, CREATININE, BUN, CO2, TSH, PSA, INR, GLUF, HGBA1C, MICROALBUR       Assessment & Plan:

## 2013-05-03 NOTE — Patient Instructions (Signed)

## 2013-05-31 ENCOUNTER — Inpatient Hospital Stay (HOSPITAL_COMMUNITY): Payer: BC Managed Care – PPO | Admitting: Anesthesiology

## 2013-05-31 ENCOUNTER — Encounter (HOSPITAL_COMMUNITY): Payer: Self-pay

## 2013-05-31 ENCOUNTER — Encounter (HOSPITAL_COMMUNITY): Payer: Self-pay | Admitting: Anesthesiology

## 2013-05-31 ENCOUNTER — Inpatient Hospital Stay (HOSPITAL_COMMUNITY)
Admission: AD | Admit: 2013-05-31 | Discharge: 2013-06-02 | DRG: 775 | Disposition: A | Discharge status: Home / Self Care | Payer: BC Managed Care – PPO | Source: Ambulatory Visit | Attending: Obstetrics & Gynecology | Admitting: Obstetrics & Gynecology

## 2013-05-31 ENCOUNTER — Encounter (HOSPITAL_COMMUNITY): Payer: BC Managed Care – PPO | Admitting: Anesthesiology

## 2013-05-31 DIAGNOSIS — D649 Anemia, unspecified: Secondary | ICD-10-CM | POA: Diagnosis not present

## 2013-05-31 DIAGNOSIS — Z22330 Carrier of Group B streptococcus: Secondary | ICD-10-CM

## 2013-05-31 DIAGNOSIS — O9903 Anemia complicating the puerperium: Secondary | ICD-10-CM | POA: Diagnosis not present

## 2013-05-31 DIAGNOSIS — O99892 Other specified diseases and conditions complicating childbirth: Secondary | ICD-10-CM | POA: Diagnosis present

## 2013-05-31 HISTORY — DX: Scoliosis, unspecified: M41.9

## 2013-05-31 LAB — CBC
HCT: 33.9 % — ABNORMAL LOW (ref 36.0–46.0)
Hemoglobin: 11.5 g/dL — ABNORMAL LOW (ref 12.0–15.0)
MCH: 28.4 pg (ref 26.0–34.0)
MCHC: 33.9 g/dL (ref 30.0–36.0)
MCV: 83.7 fL (ref 78.0–100.0)
RBC: 4.05 MIL/uL (ref 3.87–5.11)
RDW: 13.2 % (ref 11.5–15.5)

## 2013-05-31 LAB — OB RESULTS CONSOLE RPR: RPR: NONREACTIVE

## 2013-05-31 LAB — OB RESULTS CONSOLE ABO/RH: RH Type: POSITIVE

## 2013-05-31 LAB — OB RESULTS CONSOLE GC/CHLAMYDIA: Gonorrhea: NEGATIVE

## 2013-05-31 LAB — OB RESULTS CONSOLE HIV ANTIBODY (ROUTINE TESTING): HIV: NONREACTIVE

## 2013-05-31 LAB — OB RESULTS CONSOLE ANTIBODY SCREEN: Antibody Screen: NEGATIVE

## 2013-05-31 LAB — OB RESULTS CONSOLE RUBELLA ANTIBODY, IGM: Rubella: IMMUNE

## 2013-05-31 MED ORDER — LACTATED RINGERS IV SOLN
INTRAVENOUS | Status: DC
  Administered 2013-05-31: 125 mL/h via INTRAVENOUS
  Administered 2013-05-31 (×2): via INTRAVENOUS

## 2013-05-31 MED ORDER — LIDOCAINE HCL (PF) 1 % IJ SOLN
INTRAMUSCULAR | Status: DC | PRN
  Administered 2013-05-31: 3 mL
  Administered 2013-05-31: 4 mL

## 2013-05-31 MED ORDER — FENTANYL 2.5 MCG/ML BUPIVACAINE 1/10 % EPIDURAL INFUSION (WH - ANES)
INTRAMUSCULAR | Status: DC | PRN
  Administered 2013-05-31: 11.5 mL/h via EPIDURAL

## 2013-05-31 MED ORDER — FENTANYL 2.5 MCG/ML BUPIVACAINE 1/10 % EPIDURAL INFUSION (WH - ANES)
14.0000 mL/h | INTRAMUSCULAR | Status: DC | PRN
  Filled 2013-05-31: qty 125

## 2013-05-31 MED ORDER — LACTATED RINGERS IV SOLN: 500.0000 mL | INTRAVENOUS | Status: DC | PRN

## 2013-05-31 MED ORDER — LIDOCAINE HCL (PF) 1 % IJ SOLN
30.0000 mL | INTRAMUSCULAR | Status: DC | PRN
  Filled 2013-05-31: qty 30

## 2013-05-31 MED ORDER — PENICILLIN G POTASSIUM 5000000 UNITS IJ SOLR
5.0000 10*6.[IU] | Freq: Once | INTRAVENOUS | Status: AC
  Administered 2013-05-31: 5 10*6.[IU] via INTRAVENOUS
  Filled 2013-05-31: qty 5

## 2013-05-31 MED ORDER — ONDANSETRON HCL 4 MG/2ML IJ SOLN: 4.0000 mg | Freq: Four times a day (QID) | INTRAMUSCULAR | Status: DC | PRN

## 2013-05-31 MED ORDER — EPHEDRINE 5 MG/ML INJ
10.0000 mg | INTRAVENOUS | Status: DC | PRN
  Filled 2013-05-31: qty 4
  Filled 2013-05-31: qty 2

## 2013-05-31 MED ORDER — NALBUPHINE SYRINGE 5 MG/0.5 ML
5.0000 mg | INJECTION | INTRAMUSCULAR | Status: DC | PRN
  Administered 2013-05-31 (×2): 5 mg via SUBCUTANEOUS
  Filled 2013-05-31 (×4): qty 0.5

## 2013-05-31 MED ORDER — NALBUPHINE SYRINGE 5 MG/0.5 ML
5.0000 mg | INJECTION | INTRAMUSCULAR | Status: DC | PRN
  Administered 2013-05-31 (×2): 5 mg via INTRAVENOUS
  Filled 2013-05-31 (×3): qty 0.5

## 2013-05-31 MED ORDER — ACETAMINOPHEN 325 MG PO TABS: 650.0000 mg | ORAL_TABLET | ORAL | Status: DC | PRN

## 2013-05-31 MED ORDER — OXYTOCIN 40 UNITS IN LACTATED RINGERS INFUSION - SIMPLE MED: 62.5000 mL/h | INTRAVENOUS | Status: DC

## 2013-05-31 MED ORDER — TERBUTALINE SULFATE 1 MG/ML IJ SOLN: 0.2500 mg | Freq: Once | INTRAMUSCULAR | Status: AC | PRN

## 2013-05-31 MED ORDER — DIPHENHYDRAMINE HCL 50 MG/ML IJ SOLN: 12.5000 mg | INTRAMUSCULAR | Status: DC | PRN

## 2013-05-31 MED ORDER — EPHEDRINE 5 MG/ML INJ
10.0000 mg | INTRAVENOUS | Status: DC | PRN
  Filled 2013-05-31: qty 2

## 2013-05-31 MED ORDER — FENTANYL CITRATE 0.05 MG/ML IJ SOLN
100.0000 ug | INTRAMUSCULAR | Status: DC | PRN
  Administered 2013-05-31: 100 ug via INTRAVENOUS
  Filled 2013-05-31 (×2): qty 2

## 2013-05-31 MED ORDER — OXYCODONE-ACETAMINOPHEN 5-325 MG PO TABS: 1.0000 | ORAL_TABLET | ORAL | Status: DC | PRN

## 2013-05-31 MED ORDER — LACTATED RINGERS IV SOLN: 500.0000 mL | Freq: Once | INTRAVENOUS | Status: DC

## 2013-05-31 MED ORDER — PHENYLEPHRINE 40 MCG/ML (10ML) SYRINGE FOR IV PUSH (FOR BLOOD PRESSURE SUPPORT)
80.0000 ug | PREFILLED_SYRINGE | INTRAVENOUS | Status: DC | PRN
  Administered 2013-05-31: 80 ug via INTRAVENOUS
  Filled 2013-05-31: qty 2

## 2013-05-31 MED ORDER — PENICILLIN G POTASSIUM 5000000 UNITS IJ SOLR
2.5000 10*6.[IU] | INTRAVENOUS | Status: DC
  Administered 2013-05-31 (×5): 2.5 10*6.[IU] via INTRAVENOUS
  Filled 2013-05-31 (×9): qty 2.5

## 2013-05-31 MED ORDER — IBUPROFEN 600 MG PO TABS
600.0000 mg | ORAL_TABLET | Freq: Four times a day (QID) | ORAL | Status: DC | PRN
  Administered 2013-05-31: 600 mg via ORAL
  Filled 2013-05-31: qty 1

## 2013-05-31 MED ORDER — CITRIC ACID-SODIUM CITRATE 334-500 MG/5ML PO SOLN
30.0000 mL | ORAL | Status: DC | PRN
  Administered 2013-05-31: 30 mL via ORAL
  Filled 2013-05-31: qty 15

## 2013-05-31 MED ORDER — PHENYLEPHRINE 40 MCG/ML (10ML) SYRINGE FOR IV PUSH (FOR BLOOD PRESSURE SUPPORT)
80.0000 ug | PREFILLED_SYRINGE | INTRAVENOUS | Status: DC | PRN
  Filled 2013-05-31: qty 2
  Filled 2013-05-31: qty 10

## 2013-05-31 MED ORDER — OXYTOCIN BOLUS FROM INFUSION
500.0000 mL | INTRAVENOUS | Status: DC
  Administered 2013-05-31: 500 mL via INTRAVENOUS

## 2013-05-31 MED ORDER — OXYTOCIN 40 UNITS IN LACTATED RINGERS INFUSION - SIMPLE MED
1.0000 m[IU]/min | INTRAVENOUS | Status: DC
  Administered 2013-05-31: 2 m[IU]/min via INTRAVENOUS
  Administered 2013-05-31: 4 m[IU]/min via INTRAVENOUS
  Administered 2013-05-31: 1 m[IU]/min via INTRAVENOUS
  Administered 2013-05-31: 3 m[IU]/min via INTRAVENOUS
  Filled 2013-05-31: qty 1000

## 2013-05-31 NOTE — Anesthesia Preprocedure Evaluation (Signed)
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 negative psych ROS   GI/Hepatic negative GI ROS, Neg liver ROS,   Endo/Other  negative endocrine ROS  Renal/GU negative Renal ROS  negative genitourinary   Musculoskeletal   Abdominal   Peds  Hematology negative hematology ROS (+)   Anesthesia Other Findings   Reproductive/Obstetrics (+) Pregnancy                           Anesthesia Physical  Anesthesia Plan  ASA: II  Anesthesia Plan: Spinal   Post-op Pain Management:    Induction:   Airway Management Planned: Natural Airway  Additional Equipment:   Intra-op Plan:   Post-operative Plan:   Informed Consent: I have reviewed the patients History and Physical, chart, labs and discussed the procedure including the risks, benefits and alternatives for the proposed anesthesia with the patient or authorized representative who has indicated his/her understanding and acceptance.     Plan Discussed with:   Anesthesia Plan Comments: (Patient plans natural childbirth for vaginal delivery.  Discussed likelihood that epidural would be technically difficult and not function well due to scar tissue from her harrington rods.  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  Patient does not desire epidural for pain relief at this time.  Discussed that in the event of a C/S a spinal is a good option and would likely be successful.  Patient understands and agrees.  Jasmine December, MD)        Anesthesia Quick Evaluation

## 2013-05-31 NOTE — Anesthesia Procedure Notes (Signed)
Epidural Patient location during procedure: OB Start time: 05/31/2013 5:11 PM  Staffing Anesthesiologist: Aadhira Heffernan A. Performed by: anesthesiologist   Preanesthetic Checklist Completed: patient identified, site marked, surgical consent, pre-op evaluation, timeout performed, IV checked, risks and benefits discussed and monitors and equipment checked  Epidural Patient position: sitting Prep: site prepped and draped and DuraPrep Patient monitoring: continuous pulse ox and blood pressure Approach: midline Injection technique: LOR air  Needle:  Needle type: Tuohy  Needle gauge: 17 G Needle length: 9 cm and 9 Needle insertion depth: 4 cm Catheter type: closed end flexible Catheter size: 19 Gauge Catheter at skin depth: 9 cm Test dose: negative and Other  Assessment Events: blood not aspirated, injection not painful, no injection resistance, negative IV test and no paresthesia  Additional Notes Technically difficult due to severe scoliosis. Attempt x 4. L3-L4 x 2, L4-L5 x1.  Successful at L5-S1. Good relief from pain of contractions. Patient tolerated procedure well.

## 2013-05-31 NOTE — Progress Notes (Signed)
Lindsey Tucker is a 27 y.o. G1P0 at [redacted]w[redacted]d by LMP admitted for rupture of membranes  Subjective: Uncomfortable.  Objective: BP 130/91  Pulse 89  Temp(Src) 99.3 F (37.4 C) (Axillary)  Resp 16  Ht 5' (1.524 m)  Wt 55.792 kg (123 lb)  BMI 24.02 kg/m2      FHT:  FHR: 155 bpm, variability: moderate,  accelerations:  Present,  decelerations:  Absent UC:   regular, every 3 minutes SVE:   Dilation: 4 Effacement (%): 90 Station: -1 Exam by:: Dr Billy Coast IUPC placed without difficulty Labs: Lab Results  Component Value Date   WBC 11.2* 05/31/2013   HGB 11.5* 05/31/2013   HCT 33.9* 05/31/2013   MCV 83.7 05/31/2013   PLT 277 05/31/2013    Assessment / Plan: Augmentation of labor, progressing well but slowly  Labor: Progressing normally Preeclampsia:  no signs or symptoms of toxicity, intake and ouput balanced and labs stable Fetal Wellbeing:  Category I Pain Control:  Fentanyl I/D:  n/a Anticipated MOD:  attempts for SVD Anesthesia called to discuss epidural.  Lindsey Tucker 05/31/2013, 4:09 PM

## 2013-05-31 NOTE — Progress Notes (Signed)
Lindsey Tucker is a 27 y.o. G1P0 at [redacted]w[redacted]d by LMP admitted for rupture of membranes  Subjective: Comfortable  Objective: BP 106/71  Pulse 87  Temp(Src) 99.3 F (37.4 C) (Axillary)  Resp 16  Ht 5' (1.524 m)  Wt 55.792 kg (123 lb)  BMI 24.02 kg/m2  SpO2 98%      FHT:  FHR: 145 bpm, variability: moderate,  accelerations:  Present,  decelerations:  Absent UC:   irregular, every 5-7 minutes SVE:   10/100/+1  Labs: Lab Results  Component Value Date   WBC 11.2* 05/31/2013   HGB 11.5* 05/31/2013   HCT 33.9* 05/31/2013   MCV 83.7 05/31/2013   PLT 277 05/31/2013    Assessment / Plan: Augmentation of labor, progressing well  Labor: Progressing normally Preeclampsia:  no signs or symptoms of toxicity Fetal Wellbeing:  Category I Pain Control:  Epidural I/D:  n/a Anticipated MOD:  NSVD  Lindsey Tucker J 05/31/2013, 7:47 PM

## 2013-05-31 NOTE — MAU Note (Signed)
Patient is in with c/o SROM at 2300 with clear fluids. Her underwear and pad that she came in with is completely saturated. fht 172. No bleeding observed. She is GBS+. Denies any complication with this pregnancy.

## 2013-05-31 NOTE — H&P (Signed)
Subjective:  Lindsey Tucker is a 27 y.o. G1 P0 female with EDC 06/12/13 at 24 and 2/[redacted] weeks gestation who is being admitted for labor management.  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  Patient reports no complaints.   Fetal Movement: normal.  SROM with clear AF.  Feels mild UCs.  H/O mild Asthma H/O Scoliosis, fusion T6-L4    Objective:   Vital signs in last 24 hours: Temp:  [98.6 F (37 C)] 98.6 F (37 C) (12/12 0100) Pulse Rate:  [102] 102 (12/12 0048) Resp:  [18] 18 (12/12 0100) BP: (128)/(85) 128/85 mmHg (12/12 0048) Weight:  [55.792 kg (123 lb)] 55.792 kg (123 lb) (12/12 0100)   General:   alert  Skin:   normal  HEENT:  PERRLA  Lungs:   clear to auscultation bilaterally  Heart:   regular rate and rhythm  Breasts:   Deferred  Abdomen:  Gravid  Pelvis:  Exam deferred.  FHT:  140's BPM  Good variability, accelerations present,no deceleration.    Uterine Size: size equals dates  Presentations: cephalic  Cervix:    Dilation: 1cm   Effacement: 70%   Station:  -1   Consistency: soft   Position: post   Lab Review  Rh+  AFP:NML  One hour GTT: Normal   GBS pos in urine  Assessment/Plan:  38 and 2/[redacted] weeks gestation.  SROM clear AF with mild UCs.  Fetal well-being reassuring. Early latent labor. Obstetrical history significant for GBS colonizer.  Start Pen G. H/O Scoliosis with fusion T6-L4.  Anesthesia informed, consultation requested.    Risks, benefits, alternatives and possible complications have been discussed in detail with the patient.  Pre-admission, admission, and post admission procedures and expectations were discussed in detail.  All questions answered, all appropriate consents will be signed at the Hospital. Admission is planned for today.  Expectant management.

## 2013-05-31 NOTE — Anesthesia Preprocedure Evaluation (Deleted)
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 negative psych ROS   GI/Hepatic negative GI ROS, Neg liver ROS,   Endo/Other  negative endocrine ROS  Renal/GU negative Renal ROS     Musculoskeletal   Abdominal   Peds  Hematology negative hematology ROS (+)   Anesthesia Other Findings   Reproductive/Obstetrics (+) Pregnancy                           Anesthesia Physical Anesthesia Plan  ASA: II  Anesthesia Plan: Spinal   Post-op Pain Management:    Induction:   Airway Management Planned:   Additional Equipment:   Intra-op Plan:   Post-operative Plan:   Informed Consent: I have reviewed the patients History and Physical, chart, labs and discussed the procedure including the risks, benefits and alternatives for the proposed anesthesia with the patient or authorized representative who has indicated his/her understanding and acceptance.     Plan Discussed with:   Anesthesia Plan Comments: (Patient plans natural childbirth for vaginal delivery.  Discussed likelihood that epidural would be technically difficult and not function well due to scar tissue from her harrington rods.  Also discussed high risk of dural puncture and inability to perform blood patch to relieve PDPH.  Patient does not desire epidural for pain relief at  this time.  Discussed that in the event of a C/S a spinal is a good option and would likely be successful.  Patient understands and agrees.  Jasmine December, MD)        Anesthesia Quick Evaluation

## 2013-05-31 NOTE — Progress Notes (Signed)
Lindsey Tucker is a 27 y.o. G1P0 at [redacted]w[redacted]d by LMP admitted for rupture of membranes  Subjective: Feel some pressure Pushing x 1 1/2 hrs  Objective: BP 115/70  Pulse 87  Temp(Src) 98.2 F (36.8 C) (Axillary)  Resp 18  Ht 5' (1.524 m)  Wt 55.792 kg (123 lb)  BMI 24.02 kg/m2  SpO2 97%   Total I/O In: -  Out: 150 [Urine:150]  FHT:  FHR: 160-170 bpm, variability: moderate,  accelerations:  Present,  decelerations:  Present occ variable , occ late UC:   regular, every 2 minutes SVE:   10/100/ +3  Labs: Lab Results  Component Value Date   WBC 11.2* 05/31/2013   HGB 11.5* 05/31/2013   HCT 33.9* 05/31/2013   MCV 83.7 05/31/2013   PLT 277 05/31/2013    Assessment / Plan: Augmentation of labor, progressing well  Labor: Progressing normally Preeclampsia:  no signs or symptoms of toxicity Fetal Wellbeing:  Category I and Category II Pain Control:  Epidural I/D:  n/a Anticipated MOD:  NSVD  Jannah Guardiola J 05/31/2013, 9:27 PM

## 2013-05-31 NOTE — Progress Notes (Signed)
Subjective: Doing well, pain moderate, UCs q2-3 min  Anesthesia none, analgesia Nubain s/c and IV 2 hrs ago.   Objective: BP 126/91  Pulse 96  Temp(Src) 98.1 F (36.7 C) (Axillary)  Resp 16  Ht 5' (1.524 m)  Wt 55.792 kg (123 lb)  BMI 24.02 kg/m2   FHT:  FHR: 140's bpm, variability: moderate,  accelerations:  Present,  decelerations:  Absent UC:   regular, every 2-3 minutes VE:   Dilation: 3 Effacement (%): 90 Station: -2 Exam by:: Dr Seymour Bars   Assessment / Plan: Augmentation of labor, progressing well.  Entering active labor. Scoliosis post fusion.  Seen by anesthesia.  Fetal Wellbeing:  Category I Pain Control:  Nubain  Anticipated MOD:  NSVD  Fredis Malkiewicz,MARIE-LYNE 05/31/2013, 12:05 PM

## 2013-06-01 ENCOUNTER — Encounter (HOSPITAL_COMMUNITY): Payer: Self-pay | Admitting: Obstetrics and Gynecology

## 2013-06-01 LAB — CBC
MCH: 28.6 pg (ref 26.0–34.0)
MCV: 82.9 fL (ref 78.0–100.0)
Platelets: 221 10*3/uL (ref 150–400)
RBC: 3.22 MIL/uL — ABNORMAL LOW (ref 3.87–5.11)
WBC: 18.5 10*3/uL — ABNORMAL HIGH (ref 4.0–10.5)

## 2013-06-01 MED ORDER — DIPHENHYDRAMINE HCL 25 MG PO CAPS: 25.0000 mg | ORAL_CAPSULE | Freq: Four times a day (QID) | ORAL | Status: DC | PRN

## 2013-06-01 MED ORDER — DIBUCAINE 1 % RE OINT: 1.0000 "application " | TOPICAL_OINTMENT | RECTAL | Status: DC | PRN

## 2013-06-01 MED ORDER — ZOLPIDEM TARTRATE 5 MG PO TABS: 5.0000 mg | ORAL_TABLET | Freq: Every evening | ORAL | Status: DC | PRN

## 2013-06-01 MED ORDER — PRENATAL MULTIVITAMIN CH
1.0000 | ORAL_TABLET | Freq: Every day | ORAL | Status: DC
  Filled 2013-06-01 (×2): qty 1

## 2013-06-01 MED ORDER — POLYSACCHARIDE IRON COMPLEX 150 MG PO CAPS
150.0000 mg | ORAL_CAPSULE | Freq: Two times a day (BID) | ORAL | Status: DC
  Administered 2013-06-02: 150 mg via ORAL
  Filled 2013-06-01 (×5): qty 1

## 2013-06-01 MED ORDER — METHYLERGONOVINE MALEATE 0.2 MG/ML IJ SOLN: 0.2000 mg | INTRAMUSCULAR | Status: DC | PRN

## 2013-06-01 MED ORDER — METHYLERGONOVINE MALEATE 0.2 MG PO TABS: 0.2000 mg | ORAL_TABLET | ORAL | Status: DC | PRN

## 2013-06-01 MED ORDER — LANOLIN HYDROUS EX OINT: TOPICAL_OINTMENT | CUTANEOUS | Status: DC | PRN

## 2013-06-01 MED ORDER — IBUPROFEN 600 MG PO TABS
600.0000 mg | ORAL_TABLET | Freq: Four times a day (QID) | ORAL | Status: DC
  Administered 2013-06-01 – 2013-06-02 (×6): 600 mg via ORAL
  Filled 2013-06-01 (×6): qty 1

## 2013-06-01 MED ORDER — WITCH HAZEL-GLYCERIN EX PADS: 1.0000 "application " | MEDICATED_PAD | CUTANEOUS | Status: DC | PRN

## 2013-06-01 MED ORDER — OXYCODONE-ACETAMINOPHEN 5-325 MG PO TABS: 1.0000 | ORAL_TABLET | ORAL | Status: DC | PRN

## 2013-06-01 MED ORDER — ONDANSETRON HCL 4 MG/2ML IJ SOLN: 4.0000 mg | INTRAMUSCULAR | Status: DC | PRN

## 2013-06-01 MED ORDER — SIMETHICONE 80 MG PO CHEW: 80.0000 mg | CHEWABLE_TABLET | ORAL | Status: DC | PRN

## 2013-06-01 MED ORDER — TETANUS-DIPHTH-ACELL PERTUSSIS 5-2.5-18.5 LF-MCG/0.5 IM SUSP: 0.5000 mL | Freq: Once | INTRAMUSCULAR | Status: DC

## 2013-06-01 MED ORDER — SENNOSIDES-DOCUSATE SODIUM 8.6-50 MG PO TABS
2.0000 | ORAL_TABLET | ORAL | Status: DC
  Filled 2013-06-01: qty 2

## 2013-06-01 MED ORDER — BENZOCAINE-MENTHOL 20-0.5 % EX AERO
1.0000 "application " | INHALATION_SPRAY | CUTANEOUS | Status: DC | PRN
  Administered 2013-06-01: 1 via TOPICAL
  Filled 2013-06-01: qty 56

## 2013-06-01 MED ORDER — ONDANSETRON HCL 4 MG PO TABS: 4.0000 mg | ORAL_TABLET | ORAL | Status: DC | PRN

## 2013-06-01 NOTE — Progress Notes (Addendum)
PPD #1- SVD  Subjective:   Reports feeling good, perineum sore Tolerating po/ No nausea or vomiting Bleeding is light No dizziness, SOB, or chest pain Pain controlled with Motrin Up ad lib / ambulatory / voiding without problems Newborn: breastfeeding-baby sleepy and jaundice (on bili lights)    Objective:   VS:  VS:  Filed Vitals:   06/01/13 0025 06/01/13 0125 06/01/13 0533 06/01/13 1046  BP: 110/67 120/77 95/55 109/70  Pulse: 117 123 109 78  Temp: 100 F (37.8 C) 98.5 F (36.9 C) 98.3 F (36.8 C) 98.4 F (36.9 C)  TempSrc: Oral Oral Oral Oral  Resp: 20 20 20 20   Height:      Weight:      SpO2:        LABS:  Recent Labs  05/31/13 0140 06/01/13 0620  WBC 11.2* 18.5*  HGB 11.5* 9.2*  PLT 277 221   Blood type: --/--/O POS, O POS (12/12 0140) Rubella: Immune (12/12 0000)   I&O: Intake/Output     12/12 0701 - 12/13 0700 12/13 0701 - 12/14 0700   Urine (mL/kg/hr) 150 (0.1)    Blood 300 (0.2)    Total Output 450     Net -450            Physical Exam: Alert and oriented x3 Abdomen: soft, non-tender, non-distended  Fundus: firm, non-tender, U-1 Perineum: Well approximated, no significant erythema, edema, or drainage; healing well. Lochia: small Extremities: no edema, no calf pain or tenderness    Assessment:  PPD # 1G1P1001/ S/P: VAVD, 2nd degree laceration Anemia  Doing well    Plan: Continue routine post partum orders Start Niferex Anticipate D/C home in AM   [place], Hewitt Garner, N MSN, CNM 06/01/2013, 11:25 AM

## 2013-06-01 NOTE — Anesthesia Postprocedure Evaluation (Signed)
  Anesthesia Post-op Note  Patient: Lindsey Tucker  Procedure(s) Performed: * No procedures listed *  Patient Location: PACU and Mother/Baby  Anesthesia Type:Epidural  Level of Consciousness: awake, alert  and oriented  Airway and Oxygen Therapy: Patient Spontanous Breathing  Post-op Pain: none  Post-op Assessment: Post-op Vital signs reviewed, Patient's Cardiovascular Status Stable, No headache, No backache, No residual numbness and No residual motor weakness  Post-op Vital Signs: Reviewed and stable  Complications: No apparent anesthesia complications

## 2013-06-01 NOTE — Progress Notes (Signed)
Anesthesia post delivery note  Some concern for intrathecal catheter. Pt sitting up in bed without any complaint. She denies headache and expressed gratitude for Dr. Mercy Riding help and concern.

## 2013-06-02 MED ORDER — POLYSACCHARIDE IRON COMPLEX 150 MG PO CAPS: 150.0000 mg | ORAL_CAPSULE | Freq: Two times a day (BID) | ORAL | Status: DC

## 2013-06-02 MED ORDER — ALBUTEROL SULFATE HFA 108 (90 BASE) MCG/ACT IN AERS: 2.0000 | INHALATION_SPRAY | Freq: Four times a day (QID) | RESPIRATORY_TRACT | Status: DC | PRN

## 2013-06-02 MED ORDER — IBUPROFEN 600 MG PO TABS: 600.0000 mg | ORAL_TABLET | Freq: Four times a day (QID) | ORAL | Status: DC

## 2013-06-02 NOTE — Progress Notes (Signed)
Patient ID: Lindsey Tucker, female   DOB: 21-Sep-1985, 27 y.o.   MRN: [id number] Post Partum Day #2            Information for the patient's newborn:  Lindsey Tucker, Lindsey Tucker [[id number]]  female  Feeding: breast  Subjective: No HA, SOB, CP, F/C, breast symptoms. Pain well-controlled with ibuprofen. Normal vaginal bleeding, no clots.      Objective:  Temp:  [97.4 F (36.3 C)-98.4 F (36.9 C)] 97.9 F (36.6 C) (12/14 0651) Pulse Rate:  [78-99] 90 (12/14 0651) Resp:  [18-20] 18 (12/14 0651) BP: (96-115)/(59-70) 96/59 mmHg (12/14 0651) SpO2:  [100 %] 100 % (12/14 0651)  No intake or output data in the 24 hours ending 06/02/13 0853     Recent Labs  05/31/13 0140 06/01/13 0620  WBC 11.2* 18.5*  HGB 11.5* 9.2*  HCT 33.9* 26.7*  PLT 277 221    Blood type: --/--/O POS, O POS (12/12 0140) Rubella: Immune (12/12 0000)    Physical Exam:  General: alert, cooperative and no distress Uterine Fundus: firm, midline, U-2 Lochia: appropriate Perineum: 2nd degree repair healing well, edema mild DVT Evaluation: No evidence of DVT seen on physical exam. Negative Homan's sign. No cords or calf tenderness. No significant calf/ankle edema.    Assessment/Plan: PPD # 2 / 27 y.o., G1P1001 S/P: spontaneous vaginal delivery with 2nd degree laceration  Anemia    Normal postpartum exam  Continue current postpartum care  D/C home   LOS: 2 days   Lindsey Tucker, M, MSN, CNM 06/02/2013, 8:53 AM

## 2013-06-02 NOTE — Discharge Summary (Signed)
Obstetric Discharge Summary Reason for Admission: rupture of membranes Prenatal Procedures: ultrasound Intrapartum Procedures: spontaneous vaginal delivery Postpartum Procedures: none Complications-Operative and Postpartum: 2nd degree perineal laceration Hemoglobin  Date Value Range Status  06/01/2013 9.2* 12.0 - 15.0 g/dL Final     REPEATED TO VERIFY     DELTA CHECK NOTED     HCT  Date Value Range Status  06/01/2013 26.7* 36.0 - 46.0 % Final    Physical Exam:  General: alert, cooperative and no distress Lochia: appropriate Uterine Fundus: firm, midline, U-2 DVT Evaluation: No evidence of DVT seen on physical exam. Negative Homan's sign. No cords or calf tenderness. No significant calf/ankle edema.  Discharge Diagnoses: Term Pregnancy-delivered       Anemia Discharge Information: Date: 06/02/2013 Activity: pelvic rest Diet: routine Medications: PNV, Ibuprofen and Iron Condition: stable Instructions: refer to practice specific booklet Discharge to: home Follow-up Information   Follow up with [facility] A., MD. Schedule an appointment as soon as possible for a visit in 6 weeks.   Specialty:  Obstetrics and Gynecology   Contact information:   [address] [place] [address] [phone]       Newborn Data: Live born female on 05/[birth year]/2013 Birth Weight: 5 lb 13.1 oz (2640 g) APGAR: 9, 9  Baby to remain inpatient d/t increase bilirubin levels / bili light therapy.  Kenard Gower, MSN, CNM 06/02/2013, 9:02 AM

## 2013-06-04 ENCOUNTER — Encounter (HOSPITAL_COMMUNITY)
Admission: RE | Admit: 2013-06-04 | Discharge: 2013-06-04 | Disposition: A | Discharge status: Home / Self Care | Payer: BC Managed Care – PPO | Source: Ambulatory Visit | Attending: Obstetrics | Admitting: Obstetrics

## 2013-06-04 ENCOUNTER — Ambulatory Visit: Payer: Self-pay

## 2013-06-04 DIAGNOSIS — O923 Agalactia: Secondary | ICD-10-CM | POA: Insufficient documentation

## 2013-06-04 NOTE — Lactation Note (Signed)
This note was copied from the chart of Lindsey Okema Rollinson. Lactation Consultation Note Mom states breast feeding is getting better, states her milk is increasing, mom is pumping to provide supplement, and also offering formula when needed.  Discussed mom and dad's questions and strategies to exclusively breast feed. Mom request pump rental, because hers will not arrive for another 2 weeks. Pump rental provided.  At this time, mom does not want to use an at-breast supplement system, prefers bottle. Mom does use the nipple shields and comfort gels. Enc mom to call the lactation office if she has any concerns, and to attend the BFSG.  Patient Name: Lindsey Tucker [id number] Date: 06/04/2013     Maternal Data    Feeding    LATCH Score/Interventions                      Lactation Tools Discussed/Used     Consult Status      Lenard Forth 06/04/2013, 11:18 AM

## 2013-07-02 ENCOUNTER — Ambulatory Visit (HOSPITAL_COMMUNITY)
Admission: RE | Admit: 2013-07-02 | Discharge: 2013-07-02 | Disposition: A | Discharge status: Home / Self Care | Payer: BC Managed Care – PPO | Source: Ambulatory Visit | Attending: Obstetrics | Admitting: Obstetrics

## 2013-07-02 NOTE — Lactation Note (Signed)
Adult Lactation Consultation Outpatient Visit Note  Patient Name: Lindsey MastSamantha Kogler(mother)     BABY: KATE Lundblad Date of Birth: Aug 19, 1985                               DOB: [birth year] Gestational Age at Delivery: 2838.2                   BIRTH WEIGHT: 5-13.1 Type of Delivery: NVD                                     DISCHARGE WEIGHT: 5-7.5                                                                          WEIGHT TODAY: 6-15.3 Breastfeeding History: Frequency of Breastfeeding: EVERY 2-3 HOURS DURING DAY AND EVERY 4 HOURS AT NIGHT Length of Feeding: 15-20 MINUTES Voids: 6-8 Stools: 1-2 QD OR QOD  Supplementing / Method:FORMULA 30 MLS AFTER EACH BREASTFEEDING PER BOTTLE Pumping:  Type of Pump:MEDELA PUMP IN STYLE   Frequency:STARTED BACK 3 DAYS AGO AFTER DAYTIME FEEDINGS  Volume:  60 MLS TOTAL FROM ENTIRE DAY  Comments:    Consultation Evaluation: Mom and 214 week old baby here for feeding assessment.  Baby had a difficult latch in the hospital and a nipple shield was initiated.  Mom also started pumping in the hospital and baby was receiving supplemental formula.    Mom pumped post feeds the first 2 weeks but baby was not gaining weight adequate weight and MD recommended formula supplementation.  Since supplementation baby gaining adequately.  Mom reports breast changes in pregnancy and no risk factors for decreased milk supply.  Observed baby latch using a 16 mm nipple shield.  Attempted first without the shield but baby unable to latch,  Baby nursed for 15 minutes on each breast with intermittent good bursts of activity.  Baby transferred 46 mls.  SNS set up and demonstrated to mom for possible use at home.  Baby took about 20 mls then became tired.  Plan is to breastfeed on cue.  Mom will continue to supplement with SNS or bottle with 30-60 mls of formula/EBM.  2) consider starting herbal supplements 3) pump after feedings x 15 minutes during day 4) follow up appointment 07/09/13 1  00pm  Initial Feeding Assessment:15 MINUTES EACH BREAST Pre-feed [id number]:3154 Post-feed Weight:3200 Amount Transferred:46 MLS Comments:  Additional Feeding Assessment: Pre-feed Weight: Post-feed Weight: Amount Transferred: Comments:  Additional Feeding Assessment: Pre-feed Weight: Post-feed Weight: Amount Transferred: Comments:  Total Breast milk Transferred this Visit: 46 MLS Total Supplement Given: 45 MLS EBM  Additional Interventions:   Follow-Up 07/09/13 1 00PM      [name], Nahome Bublitz Ann 07/02/2013, 12:14 PM

## 2013-07-09 ENCOUNTER — Encounter (HOSPITAL_COMMUNITY): Payer: BC Managed Care – PPO

## 2013-07-09 ENCOUNTER — Ambulatory Visit (HOSPITAL_COMMUNITY)
Admission: RE | Admit: 2013-07-09 | Discharge: 2013-07-09 | Disposition: A | Discharge status: Home / Self Care | Payer: BC Managed Care – PPO | Source: Ambulatory Visit | Attending: Obstetrics & Gynecology | Admitting: Obstetrics & Gynecology

## 2013-07-09 NOTE — Lactation Note (Addendum)
Adult Lactation Consultation Outpatient Visit Note  Patient Name: Lindsey HuffSamantha Tucker     BABY: Eligha BridegroomKATE Ouk Date of Birth: 05/12/1986                 DOB: [birth year] Gestational Age at Delivery:             BIRTH WEIGHT: 5-13.1 Type of Delivery: NVD                       WEIGHT TODAY: 7-10.5  Breastfeeding History: Frequency of Breastfeeding: EVERY 2 HOURS DURING DAY AND EVERY 5 HOURS AT NIGHT Length of Feeding: 15-30 MINUTES Voids: QS Stools: 2 YELLOW PER DAY  Supplementing / Method:EBM/FORMULA 2-3 OZ PC PER BOTTLE Pumping:  Type of Pump:debp   Frequency:4-5 TIMES/24 HRS  Volume:  1/2 OZ- 2 OZ  Comments:    Consultation Evaluation: Mom and 5 1/2 wekk old baby here for feeding assessment and follow up from 1 week ago. Baby gained 11 oz/7 days.   Mom feels like milk supply has increased and she is pumping more although baby still needs supplementation.  Baby still needs nipple shield most feedings.  Observed baby latch to left breast with 20 mm nipple shield with good wide latch and active suck/swallow.  Baby nursed well on first breast with nipple shield full of milk after feeding.  Baby was fussy on 2nd side and pulled off frequently and still acting hungry.  Mom pumped with DEBP and obtained 30 mls which she bottlefed to baby.  Encouraged mom to continue daytime post pumping and supplemnting EBM and formula if needed.  Initial Feeding Assessment:LEFT BREAST 15 minutes.right breast 6 minutes Pre-feed [id number]:3475 Post-feed [id number]:3492 Amount Transferred:27 mls Comments:  Additional Feeding Assessment: Pre-feed Weight: Post-feed Weight: Amount Transferred: Comments:  Additional Feeding Assessment: Pre-feed Weight: Post-feed Weight: Amount Transferred: Comments:  Total Breast milk Transferred this Visit: 27 mls Total Supplement Given: 30 mls EBM  Additional Interventions:   Follow-Up WILL CALL PRN      [name], [name] 07/09/2013, 1:27 PM

## 2014-04-21 ENCOUNTER — Encounter (HOSPITAL_COMMUNITY): Payer: Self-pay | Admitting: Obstetrics and Gynecology

## 2014-05-13 ENCOUNTER — Ambulatory Visit (INDEPENDENT_AMBULATORY_CARE_PROVIDER_SITE_OTHER): Payer: 59 | Admitting: Internal Medicine

## 2014-05-13 ENCOUNTER — Encounter: Payer: Self-pay | Admitting: Internal Medicine

## 2014-05-13 VITALS — BP 92/70 | HR 76 | Temp 98.2°F | Wt 86.1 lb

## 2014-05-13 DIAGNOSIS — J209 Acute bronchitis, unspecified: Secondary | ICD-10-CM

## 2014-05-13 DIAGNOSIS — J0100 Acute maxillary sinusitis, unspecified: Secondary | ICD-10-CM

## 2014-05-13 MED ORDER — HYDROCODONE-HOMATROPINE 5-1.5 MG/5ML PO SYRP
5.0000 mL | ORAL_SOLUTION | Freq: Four times a day (QID) | ORAL | Status: DC | PRN
Start: 2014-05-13 — End: 2016-01-20

## 2014-05-13 MED ORDER — AMOXICILLIN 500 MG PO CAPS: 500.0000 mg | ORAL_CAPSULE | Freq: Three times a day (TID) | ORAL | Status: DC

## 2014-05-13 NOTE — Progress Notes (Signed)
Pre visit review using our clinic review tool, if applicable. No additional management support is needed unless otherwise documented below in the visit note. 

## 2014-05-13 NOTE — Patient Instructions (Signed)

## 2014-05-13 NOTE — Progress Notes (Signed)
   Subjective:    Patient ID: Lindsey Tucker, female    DOB: 04/03/1986, 28 y.o.   MRN: [id number]  HPI Symptoms began 2 weeks ago but worsened in the last 48 hours.  Initially she had head congestion and nonproductive cough  Now she describes facial pain with green drainage. She also has cough productive of green sputum  The secretions from the chest are greater in volume than from the head.  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 She does not smoke.   Review of Systems Frontal headache, dental pain, sore throat , otic pain or otic discharge denied. No fever , chills or sweats.     Objective:   Physical Exam General appearance:good health ;well nourished; no acute distress or increased work of breathing is present.  No  lymphadenopathy about the head, neck, or axilla noted.   Eyes: No conjunctival inflammation or lid edema is present. There is no scleral icterus.  Ears:  External ear exam shows no significant lesions or deformities.  Otoscopic examination reveals clear canals, tympanic membranes are intact bilaterally without bulging, retraction, inflammation or discharge.  Nose:  External nasal examination shows no deformity or inflammation. Nasal mucosa are pink and moist without lesions or exudates. No septal dislocation or deviation.No obstruction to airflow.   Oral exam: Dental hygiene is good; lips and gums are healthy appearing.There is no oropharyngeal erythema or exudate noted.   Neck:  No deformities, thyromegaly, masses, or tenderness noted.   Supple with full range of motion without pain.   Heart:  Normal rate and regular rhythm. S1 and S2 normal without gallop, murmur, click, rub or other extra sounds.   Lungs:Chest clear to auscultation; no wheezes, rhonchi,rales ,or rubs present.No increased work of breathing.  Minor dry cough  Extremities:  No cyanosis, edema, or clubbing  noted    Skin:  Warm & dry w/o jaundice or tenting.         Assessment & Plan:  #1 acute bronchitis w/o bronchospasm #2 URI, acute Plan: See orders and recommendations

## 2014-05-19 ENCOUNTER — Telehealth: Payer: Self-pay | Admitting: Internal Medicine

## 2014-05-19 MED ORDER — CEFUROXIME AXETIL 250 MG PO TABS: 250.0000 mg | ORAL_TABLET | Freq: Two times a day (BID) | ORAL | Status: DC

## 2014-05-19 NOTE — Telephone Encounter (Signed)
Pt called in and said that she seen Dr Alwyn RenHopper last week and the antidotic that she had gotten are not working.  She still feels the same.  She wanted to know if he could call in something more?

## 2014-05-19 NOTE — Telephone Encounter (Signed)
Cefuroxime 250 bid #[phone]

## 2015-07-15 LAB — OB RESULTS CONSOLE ABO/RH: RH Type: POSITIVE

## 2015-07-15 LAB — OB RESULTS CONSOLE ANTIBODY SCREEN: ANTIBODY SCREEN: NEGATIVE

## 2015-07-15 LAB — OB RESULTS CONSOLE GC/CHLAMYDIA
CHLAMYDIA, DNA PROBE: NEGATIVE
GC PROBE AMP, GENITAL: NEGATIVE

## 2015-07-15 LAB — OB RESULTS CONSOLE HIV ANTIBODY (ROUTINE TESTING): HIV: NONREACTIVE

## 2015-07-15 LAB — OB RESULTS CONSOLE RUBELLA ANTIBODY, IGM: Rubella: IMMUNE

## 2015-07-15 LAB — OB RESULTS CONSOLE RPR: RPR: NONREACTIVE

## 2015-07-15 LAB — OB RESULTS CONSOLE HEPATITIS B SURFACE ANTIGEN: Hepatitis B Surface Ag: NEGATIVE

## 2015-08-27 LAB — OB RESULTS CONSOLE GBS: GBS: POSITIVE

## 2016-01-14 ENCOUNTER — Encounter (HOSPITAL_COMMUNITY): Payer: Self-pay | Admitting: *Deleted

## 2016-01-14 ENCOUNTER — Telehealth (HOSPITAL_COMMUNITY): Payer: Self-pay | Admitting: *Deleted

## 2016-01-15 ENCOUNTER — Encounter (HOSPITAL_COMMUNITY): Payer: Self-pay | Admitting: *Deleted

## 2016-01-15 NOTE — Telephone Encounter (Signed)
Preadmission screen  

## 2016-01-20 ENCOUNTER — Observation Stay (HOSPITAL_COMMUNITY)
Admission: AD | Admit: 2016-01-20 | Discharge: 2016-01-20 | DRG: 781 | Disposition: A | Discharge status: Home / Self Care | Payer: Managed Care, Other (non HMO) | Source: Ambulatory Visit | Attending: Obstetrics | Admitting: Obstetrics

## 2016-01-20 ENCOUNTER — Encounter (HOSPITAL_COMMUNITY): Payer: Self-pay

## 2016-01-20 DIAGNOSIS — F419 Anxiety disorder, unspecified: Secondary | ICD-10-CM | POA: Diagnosis not present

## 2016-01-20 DIAGNOSIS — M419 Scoliosis, unspecified: Secondary | ICD-10-CM | POA: Diagnosis present

## 2016-01-20 DIAGNOSIS — D649 Anemia, unspecified: Secondary | ICD-10-CM | POA: Diagnosis present

## 2016-01-20 DIAGNOSIS — O321XX0 Maternal care for breech presentation, not applicable or unspecified: Principal | ICD-10-CM | POA: Diagnosis present

## 2016-01-20 DIAGNOSIS — O3663X0 Maternal care for excessive fetal growth, third trimester, not applicable or unspecified: Secondary | ICD-10-CM | POA: Diagnosis not present

## 2016-01-20 DIAGNOSIS — O9989 Other specified diseases and conditions complicating pregnancy, childbirth and the puerperium: Secondary | ICD-10-CM | POA: Diagnosis present

## 2016-01-20 DIAGNOSIS — Z3A37 37 weeks gestation of pregnancy: Secondary | ICD-10-CM

## 2016-01-20 DIAGNOSIS — O99013 Anemia complicating pregnancy, third trimester: Secondary | ICD-10-CM | POA: Diagnosis present

## 2016-01-20 DIAGNOSIS — O99343 Other mental disorders complicating pregnancy, third trimester: Secondary | ICD-10-CM | POA: Diagnosis not present

## 2016-01-20 MED ORDER — LACTATED RINGERS IV SOLN
INTRAVENOUS | Status: DC
  Administered 2016-01-20 (×2): via INTRAVENOUS

## 2016-01-20 MED ORDER — LACTATED RINGERS IV BOLUS (SEPSIS)
1000.0000 mL | Freq: Once | INTRAVENOUS | Status: AC
  Administered 2016-01-20: 1000 mL via INTRAVENOUS

## 2016-01-20 NOTE — Anesthesia Preprocedure Evaluation (Addendum)
Anesthesia Evaluation  Patient identified by MRN, date of birth, ID band Patient awake    Reviewed: Allergy & Precautions, NPO status , Patient's Chart, lab work & pertinent test results  History of Anesthesia Complications (+) history of anesthetic complications (difficult epidural placement)  Airway Mallampati: II  TM Distance: >3 FB Neck ROM: Full    Dental no notable dental hx. (+) Dental Advisory Given   Pulmonary asthma ,    Pulmonary exam normal breath sounds clear to auscultation       Cardiovascular negative cardio ROS Normal cardiovascular exam Rhythm:Regular Rate:Normal     Neuro/Psych negative neurological ROS  negative psych ROS   GI/Hepatic Neg liver ROS, GERD  Controlled,  Endo/Other  negative endocrine ROS  Renal/GU negative Renal ROS  negative genitourinary   Musculoskeletal negative musculoskeletal ROS (+)   Abdominal   Peds negative pediatric ROS (+)  Hematology negative hematology ROS (+)   Anesthesia Other Findings   Reproductive/Obstetrics (+) Pregnancy                             Anesthesia Physical Anesthesia Plan  ASA: II  Anesthesia Plan: Spinal and General   Post-op Pain Management:    Induction:   Airway Management Planned:   Additional Equipment:   Intra-op Plan:   Post-operative Plan:   Informed Consent: I have reviewed the patients History and Physical, chart, labs and discussed the procedure including the risks, benefits and alternatives for the proposed anesthesia with the patient or authorized representative who has indicated his/her understanding and acceptance.   Dental advisory given  Plan Discussed with: CRNA  Anesthesia Plan Comments: (Patient had a failed ECV today and will require a C/S. This consult was to discuss options and have a plan for her elective C/S. If you review Dr. Roni Bread epidural note from 2014, you will see that  this was very technically challenging and resulted in an intrathecal catheter placement. The patient reports that she did not have a postdural puncture headache however. She has rods from L4 to her thoracic spine per the notes. He was successful at L5/S1. We discussed her options and I informed her that it would be up to the anesthesiologist that is on call at the time of her delivery for the final plan. We discussed general anesthesia as an option as well and certainly as a backup plan if the spinal attempt was unsuccessful. We discussed that her spinal placement may be more technically challenging, could not work although more likely than an epidural, could result in an infection that may lead to severe complications including removal of hardware and major surgery, and the additional risks of a spinal anesthetic. All of her questions and concerns were addressed. She would like to continue to consider her options and discuss again with the anesthesiologist on the day of delivery. She can call back at any time with additional questions.   Day of surgery: Patient desires greatly to have a spinal anesthetic for the C/S. I discussed again with her the risks/benefits/options. I offered a general anesthetic but she would like to keep this as a backup plan should the spinal be unsuccessful. She expressed an understanding of all the risks including the consequence of major surgery to remove hardware should an infection or bleeding occur. She is willing to except these risks and desires to proceed with a spinal anesthetic for surgery. )      Anesthesia Quick Evaluation

## 2016-01-20 NOTE — Discharge Instructions (Signed)
Preterm Labor Information °Preterm labor is when labor starts at less than 37 weeks of pregnancy. The normal length of a pregnancy is 39 to 41 weeks. °CAUSES °Often, there is no identifiable underlying cause as to why a woman goes into preterm labor. One of the most common known causes of preterm labor is infection. Infections of the uterus, cervix, vagina, amniotic sac, bladder, kidney, or even the lungs (pneumonia) can cause labor to start. Other suspected causes of preterm labor include:  °· Urogenital infections, such as yeast infections and bacterial vaginosis.   °· Uterine abnormalities (uterine shape, uterine septum, fibroids, or bleeding from the placenta).   °· A cervix that has been operated on (it may fail to stay closed).   °· Malformations in the fetus.   °· Multiple gestations (twins, triplets, and so on).   °· Breakage of the amniotic sac.   °RISK FACTORS °· Having a previous history of preterm labor.   °· Having premature rupture of membranes (PROM).   °· Having a placenta that covers the opening of the cervix (placenta previa).   °· Having a placenta that separates from the uterus (placental abruption).   °· Having a cervix that is too weak to hold the fetus in the uterus (incompetent cervix).   °· Having too much fluid in the amniotic sac (polyhydramnios).   °· Taking illegal drugs or smoking while pregnant.   °· Not gaining enough weight while pregnant.   °· Being younger than 18 and older than 30 years old.   °· Having a low socioeconomic status.   °· Being African American. °SYMPTOMS °Signs and symptoms of preterm labor include:  °· Menstrual-like cramps, abdominal pain, or back pain. °· Uterine contractions that are regular, as frequent as six in an hour, regardless of their intensity (may be mild or painful). °· Contractions that start on the top of the uterus and spread down to the lower abdomen and back.   °· A sense of increased pelvic pressure.   °· A watery or bloody mucus discharge that  comes from the vagina.   °TREATMENT °Depending on the length of the pregnancy and other circumstances, your health care provider may suggest bed rest. If necessary, there are medicines that can be given to stop contractions and to mature the fetal lungs. If labor happens before 34 weeks of pregnancy, a prolonged hospital stay may be recommended. Treatment depends on the condition of both you and the fetus.  °WHAT SHOULD YOU DO IF YOU THINK YOU ARE IN PRETERM LABOR? °Call your health care provider right away. You will need to go to the hospital to get checked immediately. °HOW CAN YOU PREVENT PRETERM LABOR IN FUTURE PREGNANCIES? °You should:  °· Stop smoking if you smoke.  °· Maintain healthy weight gain and avoid chemicals and drugs that are not necessary. °· Be watchful for any type of infection. °· Inform your health care provider if you have a known history of preterm labor. °  °This information is not intended to replace advice given to you by your health care provider. Make sure you discuss any questions you have with your health care provider. °  °Document Released: 08/27/2003 Document Revised: 02/06/2013 Document Reviewed: 07/09/2012 °Elsevier Interactive Patient Education ©2016 [employer] °Fetal Movement Counts °Patient Name: __________________________________________________ Patient Due Date: ____________________ °Performing a fetal movement count is highly recommended in high-risk pregnancies, but it is good for every pregnant woman to do. Your health care provider may ask you to start counting fetal movements at 28 weeks of the pregnancy. Fetal movements often increase: °· After eating a full meal. °·   After physical activity. °· After eating or drinking something sweet or cold. °· At rest. °Pay attention to when you feel the baby is most active. This will help you notice a pattern of your baby's sleep and wake cycles and what factors contribute to an increase in fetal movement. It is important to  perform a fetal movement count at the same time each day when your baby is normally most active.  °HOW TO COUNT FETAL MOVEMENTS °1. Find a quiet and comfortable area to sit or lie down on your left side. Lying on your left side provides the best blood and oxygen circulation to your baby. °2. Write down the day and time on a sheet of paper or in a journal. °3. Start counting kicks, flutters, swishes, rolls, or jabs in a 2-hour period. You should feel at least 10 movements within 2 hours. °4. If you do not feel 10 movements in 2 hours, wait 2-3 hours and count again. Look for a change in the pattern or not enough counts in 2 hours. °SEEK MEDICAL CARE IF: °· You feel less than 10 counts in 2 hours, tried twice. °· There is no movement in over an hour. °· The pattern is changing or taking longer each day to reach 10 counts in 2 hours. °· You feel the baby is not moving as he or she usually does. °Date: ____________ Movements: ____________ Start time: ____________ Finish time: ____________  °Date: ____________ Movements: ____________ Start time: ____________ Finish time: ____________ °Date: ____________ Movements: ____________ Start time: ____________ Finish time: ____________ °Date: ____________ Movements: ____________ Start time: ____________ Finish time: ____________ °Date: ____________ Movements: ____________ Start time: ____________ Finish time: ____________ °Date: ____________ Movements: ____________ Start time: ____________ Finish time: ____________ °Date: ____________ Movements: ____________ Start time: ____________ Finish time: ____________ °Date: ____________ Movements: ____________ Start time: ____________ Finish time: ____________  °Date: ____________ Movements: ____________ Start time: ____________ Finish time: ____________ °Date: ____________ Movements: ____________ Start time: ____________ Finish time: ____________ °Date: ____________ Movements: ____________ Start time: ____________ Finish time:  ____________ °Date: ____________ Movements: ____________ Start time: ____________ Finish time: ____________ °Date: ____________ Movements: ____________ Start time: ____________ Finish time: ____________ °Date: ____________ Movements: ____________ Start time: ____________ Finish time: ____________ °Date: ____________ Movements: ____________ Start time: ____________ Finish time: ____________  °Date: ____________ Movements: ____________ Start time: ____________ Finish time: ____________ °Date: ____________ Movements: ____________ Start time: ____________ Finish time: ____________ °Date: ____________ Movements: ____________ Start time: ____________ Finish time: ____________ °Date: ____________ Movements: ____________ Start time: ____________ Finish time: ____________ °Date: ____________ Movements: ____________ Start time: ____________ Finish time: ____________ °Date: ____________ Movements: ____________ Start time: ____________ Finish time: ____________ °Date: ____________ Movements: ____________ Start time: ____________ Finish time: ____________  °Date: ____________ Movements: ____________ Start time: ____________ Finish time: ____________ °Date: ____________ Movements: ____________ Start time: ____________ Finish time: ____________ °Date: ____________ Movements: ____________ Start time: ____________ Finish time: ____________ °Date: ____________ Movements: ____________ Start time: ____________ Finish time: ____________ °Date: ____________ Movements: ____________ Start time: ____________ Finish time: ____________ °Date: ____________ Movements: ____________ Start time: ____________ Finish time: ____________ °Date: ____________ Movements: ____________ Start time: ____________ Finish time: ____________  °Date: ____________ Movements: ____________ Start time: ____________ Finish time: ____________ °Date: ____________ Movements: ____________ Start time: ____________ Finish time: ____________ °Date: ____________ Movements:  ____________ Start time: ____________ Finish time: ____________ °Date: ____________ Movements: ____________ Start time: ____________ Finish time: ____________ °Date: ____________ Movements: ____________ Start time: ____________ Finish time: ____________ °Date: ____________ Movements: ____________ Start time: ____________ Finish time: ____________ °Date: ____________ Movements: ____________ Start time: ____________   Finish time: ____________  °Date: ____________ Movements: ____________ Start time: ____________ Finish time: ____________ °Date: ____________ Movements: ____________ Start time: ____________ Finish time: ____________ °Date: ____________ Movements: ____________ Start time: ____________ Finish time: ____________ °Date: ____________ Movements: ____________ Start time: ____________ Finish time: ____________ °Date: ____________ Movements: ____________ Start time: ____________ Finish time: ____________ °Date: ____________ Movements: ____________ Start time: ____________ Finish time: ____________ °Date: ____________ Movements: ____________ Start time: ____________ Finish time: ____________  °Date: ____________ Movements: ____________ Start time: ____________ Finish time: ____________ °Date: ____________ Movements: ____________ Start time: ____________ Finish time: ____________ °Date: ____________ Movements: ____________ Start time: ____________ Finish time: ____________ °Date: ____________ Movements: ____________ Start time: ____________ Finish time: ____________ °Date: ____________ Movements: ____________ Start time: ____________ Finish time: ____________ °Date: ____________ Movements: ____________ Start time: ____________ Finish time: ____________ °Date: ____________ Movements: ____________ Start time: ____________ Finish time: ____________  °Date: ____________ Movements: ____________ Start time: ____________ Finish time: ____________ °Date: ____________ Movements: ____________ Start time: ____________ Finish  time: ____________ °Date: ____________ Movements: ____________ Start time: ____________ Finish time: ____________ °Date: ____________ Movements: ____________ Start time: ____________ Finish time: ____________ °Date: ____________ Movements: ____________ Start time: ____________ Finish time: ____________ °Date: ____________ Movements: ____________ Start time: ____________ Finish time: ____________ °  °This information is not intended to replace advice given to you by your health care provider. Make sure you discuss any questions you have with your health care provider. °  °Document Released: 07/06/2006 Document Revised: 06/27/2014 Document Reviewed: 04/02/2012 °Elsevier Interactive Patient Education ©2016 [employer] Inc[employer] ° °

## 2016-01-20 NOTE — Progress Notes (Signed)
External cephalic version attempt:  Informed consent obtained with the patient. Reactive fetal testing noted. Baseline ultrasound done. Given your debility on the monitor discussed with patient regarding risks and benefits of tubal terbutaline toco lysis were discussed but given patient's history of anxiety and the tachycardia that terbutaline can cause we decided against terbutaline.  Ultrasound was used to identify the footling breech and the fetal vertex. With lubrication on the patient's abdomen for attempts were made to elevate the sacrum by placing 2 Heymans with 8 fingers deep into the pelvis above the pubic symphysis to lift the sacrum out of the pelvis. Once the sacrum was out of the pelvis gentle pressure was placed on the fetal vertex for approximately 2 minutes in order to direct the head in a downward position. For such attempts were made but the fetus did not convert to her a vertex position. In between each attempt about 1 minute of cardiac monitoring with the ultrasound was done. Baby never had any bradycardia. After 4 attempts decision was made to hold the procedure. Patient remained on the fetal monitor for an additional hour and a half. She did have some continued your debility similar to when she presented a 1 L bolus was given and irritability slowed somewhat. Fetal heartbeat remained reassuring. Patient was given instructions for daily kick counts and to follow-up for her scheduled visit in one week. Plan was made for primary cesarean section at 39 weeks though this has yet to be scheduled. Patient did meet with the anesthesiologist to discuss her concerns about regional anesthesia in the setting of her prior scoliosis surgery.  Thurmond Hildebran A. 01/20/2016 1:52 PM  The heart is on 8 first outpatient cement Hgb why we have 8 patient and a and observation of Limited ultrasound and an attempted external cephalic version with Lolita as the she then had 2 hours of prolonged monitoring was  discharged home next on patient [facility] NLP and EBL L first SAB a and and a H we had a level outpatient visit a decision to move to surgery and then on the same day had a laparoscopic right salpingectomy for a right ectopic pregnancy and also had a left proximal and distal salpingectomy for undesired fertility the setting of a failed tubal ligation these are 2 separate procedures for 2 separate occasions and Lolita was

## 2016-01-20 NOTE — Progress Notes (Signed)
Patient being discharged after attempted version. Reviewed labor signs and symptoms, daily kick counts and instructed to call for vaginal bleeding, contractions or decreased fetal movement. Patient has an appointment on Wed 01/27/16 and will go to that appointment.  Vital signs are stable, FHR tracing reviewed and IV removed. Patient left ambulatory and left by car.

## 2016-01-20 NOTE — H&P (Signed)
Chief complaint here for external cephalic version  History of present illness: This is a 30 year old G2 P1 001 at 37 weeks and 2 days for attempted cephalic version. Patient has had a pregnancy complicated by mild anemia treated with iron, GBS bacteriuria, anxiety, LGA baby in the setting of elevated diabetic screen but normal three-hour gtt. Patient was found to have a breech presentation last week. Discussion of primary cesarean section versus attempted external version were discussed with patient who prefers the latter.  Past medical history: Anxiety, scoliosis Past surgical history, scoliosis surgery 2 Past obstetric history: Term vaginal delivery vacuum-assisted, baby less than 5 pounds  Physical exam:  Vitals:   01/20/16 0734 01/20/16 0739 01/20/16 1015 01/20/16 1105  BP: 117/66  108/72 102/67  Pulse: 92  91 99  Resp: 18  18 18   Temp: 98.3 F (36.8 C)   98.3 F (36.8 C)  TempSrc: Oral   Oral  SpO2: 100% 100%    Weight: 53.5 kg (118 lb)     Height: 5' (1.524 m)      Gen.: Well-appearing, no distress Abdomen: Soft, no right upper quadrant pain, nontender, gravid GU deferred Back no costovertebral angle tenderness Lower extremity, nontender, no edema Toco: Irregular contractions versus irritability FH: 135's, positive accelerations, no decelerations, 10 beat variability  Real-time ultrasound done for footling breech, grossly normal amniotic fluid, head to the maternal right with back to maternal left, anterior placenta  Assessment and plan, multiparous patient with prior vaginal delivery here with persistent footling breech for attempted external cephalic version. Risks benefits of the procedure including risks of placental abruption, fetal distress, premature rupture of membranes, onset of labor all of which might require cesarean section if there is not a successful version. We also discussed the possibility of a unsuccessful version and the possibility of baby reverting to a  breech position even in the instance of a successful version. Patient husband understand all associated risks and agreed to proceed as planned.  Myka Lukins A. 01/20/2016 1:49 PM

## 2016-01-21 ENCOUNTER — Other Ambulatory Visit: Payer: Self-pay | Admitting: Obstetrics

## 2016-01-22 ENCOUNTER — Telehealth (HOSPITAL_COMMUNITY): Payer: Self-pay | Admitting: *Deleted

## 2016-01-22 NOTE — Telephone Encounter (Signed)
Preadmission screen  

## 2016-01-25 ENCOUNTER — Encounter (HOSPITAL_COMMUNITY): Payer: Self-pay

## 2016-01-29 ENCOUNTER — Encounter (HOSPITAL_COMMUNITY)
Admission: RE | Admit: 2016-01-29 | Discharge: 2016-01-29 | Disposition: A | Discharge status: Home / Self Care | Payer: Managed Care, Other (non HMO) | Source: Ambulatory Visit | Attending: Obstetrics | Admitting: Obstetrics

## 2016-01-29 LAB — TYPE AND SCREEN
ABO/RH(D): O POS
ANTIBODY SCREEN: NEGATIVE

## 2016-01-29 LAB — CBC
HCT: 34.8 % — ABNORMAL LOW (ref 36.0–46.0)
HEMOGLOBIN: 11.8 g/dL — AB (ref 12.0–15.0)
MCH: 30.3 pg (ref 26.0–34.0)
MCHC: 33.9 g/dL (ref 30.0–36.0)
MCV: 89.2 fL (ref 78.0–100.0)
Platelets: 239 10*3/uL (ref 150–400)
RBC: 3.9 MIL/uL (ref 3.87–5.11)
RDW: 14.9 % (ref 11.5–15.5)
WBC: 7.5 10*3/uL (ref 4.0–10.5)

## 2016-01-29 NOTE — Patient Instructions (Signed)
20 [name]  01/29/2016   Your procedure is scheduled on:  02/01/2016  [facility] at 0830 AM.  Pick up the phone at the desk and dial [phone].   Call this number if you have problems the morning of surgery: [phone]   Remember:   Do not eat food:After Midnight.  Do not drink clear liquids: After Midnight.  Take these medicines the morning of surgery with A SIP OF WATER: none   Do not wear jewelry, make-up or nail polish.  Do not wear lotions, powders, or perfumes. You may wear deodorant.  Do not shave 48 hours prior to surgery.  Do not bring valuables to the hospital.  [facility] is not   responsible for any belongings or valuables brought to the hospital.  Contacts, dentures or bridgework may not be worn into surgery.  Leave suitcase in the car. After surgery it may be brought to your room.  For patients admitted to the hospital, checkout time is 11:00 AM the day of              discharge.   Patients discharged the day of surgery will not be allowed to drive             home.  Name and phone number of your driver: na  Special Instructions:   N/A   Please read over the following fact sheets that you were given:   Surgical Site Infection Prevention

## 2016-01-30 LAB — RPR: RPR Ser Ql: NONREACTIVE

## 2016-02-01 ENCOUNTER — Encounter (HOSPITAL_COMMUNITY)
Admission: RE | Disposition: A | Discharge status: Home / Self Care | Payer: Self-pay | Source: Ambulatory Visit | Attending: Obstetrics

## 2016-02-01 ENCOUNTER — Inpatient Hospital Stay (HOSPITAL_COMMUNITY)
Admission: RE | Admit: 2016-02-01 | Discharge: 2016-02-04 | DRG: 765 | Disposition: A | Discharge status: Home / Self Care | Payer: Managed Care, Other (non HMO) | Source: Ambulatory Visit | Attending: Obstetrics | Admitting: Obstetrics

## 2016-02-01 ENCOUNTER — Encounter (HOSPITAL_COMMUNITY): Payer: Self-pay

## 2016-02-01 ENCOUNTER — Inpatient Hospital Stay (HOSPITAL_COMMUNITY): Payer: Managed Care, Other (non HMO) | Admitting: Anesthesiology

## 2016-02-01 DIAGNOSIS — O9952 Diseases of the respiratory system complicating childbirth: Secondary | ICD-10-CM | POA: Diagnosis present

## 2016-02-01 DIAGNOSIS — O99824 Streptococcus B carrier state complicating childbirth: Secondary | ICD-10-CM | POA: Diagnosis present

## 2016-02-01 DIAGNOSIS — O9081 Anemia of the puerperium: Secondary | ICD-10-CM | POA: Diagnosis not present

## 2016-02-01 DIAGNOSIS — F419 Anxiety disorder, unspecified: Secondary | ICD-10-CM | POA: Diagnosis present

## 2016-02-01 DIAGNOSIS — K219 Gastro-esophageal reflux disease without esophagitis: Secondary | ICD-10-CM | POA: Diagnosis present

## 2016-02-01 DIAGNOSIS — Z3A39 39 weeks gestation of pregnancy: Secondary | ICD-10-CM | POA: Diagnosis not present

## 2016-02-01 DIAGNOSIS — Z8249 Family history of ischemic heart disease and other diseases of the circulatory system: Secondary | ICD-10-CM

## 2016-02-01 DIAGNOSIS — O328XX0 Maternal care for other malpresentation of fetus, not applicable or unspecified: Principal | ICD-10-CM | POA: Diagnosis present

## 2016-02-01 DIAGNOSIS — M419 Scoliosis, unspecified: Secondary | ICD-10-CM | POA: Diagnosis present

## 2016-02-01 DIAGNOSIS — Z833 Family history of diabetes mellitus: Secondary | ICD-10-CM | POA: Diagnosis not present

## 2016-02-01 DIAGNOSIS — O321XX0 Maternal care for breech presentation, not applicable or unspecified: Secondary | ICD-10-CM | POA: Diagnosis present

## 2016-02-01 DIAGNOSIS — O9962 Diseases of the digestive system complicating childbirth: Secondary | ICD-10-CM | POA: Diagnosis present

## 2016-02-01 DIAGNOSIS — D62 Acute posthemorrhagic anemia: Secondary | ICD-10-CM | POA: Diagnosis not present

## 2016-02-01 DIAGNOSIS — O99344 Other mental disorders complicating childbirth: Secondary | ICD-10-CM | POA: Diagnosis present

## 2016-02-01 DIAGNOSIS — J45909 Unspecified asthma, uncomplicated: Secondary | ICD-10-CM | POA: Diagnosis present

## 2016-02-01 DIAGNOSIS — O26893 Other specified pregnancy related conditions, third trimester: Secondary | ICD-10-CM | POA: Diagnosis present

## 2016-02-01 SURGERY — Surgical Case
Anesthesia: General | Wound class: Clean Contaminated

## 2016-02-01 MED ORDER — FENTANYL CITRATE (PF) 100 MCG/2ML IJ SOLN
INTRAMUSCULAR | Status: AC
  Administered 2016-02-01: 25 ug via INTRAVENOUS
  Filled 2016-02-01: qty 2

## 2016-02-01 MED ORDER — FENTANYL CITRATE (PF) 100 MCG/2ML IJ SOLN
INTRAMUSCULAR | Status: AC
  Filled 2016-02-01: qty 2

## 2016-02-01 MED ORDER — DIPHENHYDRAMINE HCL 25 MG PO CAPS: 25.0000 mg | ORAL_CAPSULE | Freq: Four times a day (QID) | ORAL | Status: DC | PRN

## 2016-02-01 MED ORDER — TETANUS-DIPHTH-ACELL PERTUSSIS 5-2.5-18.5 LF-MCG/0.5 IM SUSP: 0.5000 mL | Freq: Once | INTRAMUSCULAR | Status: DC

## 2016-02-01 MED ORDER — POLYSACCHARIDE IRON COMPLEX 150 MG PO CAPS
150.0000 mg | ORAL_CAPSULE | Freq: Every day | ORAL | Status: DC
  Administered 2016-02-02 – 2016-02-04 (×3): 150 mg via ORAL
  Filled 2016-02-01 (×3): qty 1

## 2016-02-01 MED ORDER — FENTANYL CITRATE (PF) 100 MCG/2ML IJ SOLN
INTRAMUSCULAR | Status: DC | PRN
  Administered 2016-02-01: 50 ug via INTRAVENOUS

## 2016-02-01 MED ORDER — ONDANSETRON HCL 4 MG/2ML IJ SOLN
INTRAMUSCULAR | Status: AC
  Filled 2016-02-01: qty 2

## 2016-02-01 MED ORDER — PHENYLEPHRINE 40 MCG/ML (10ML) SYRINGE FOR IV PUSH (FOR BLOOD PRESSURE SUPPORT)
PREFILLED_SYRINGE | INTRAVENOUS | Status: AC
  Filled 2016-02-01: qty 10

## 2016-02-01 MED ORDER — SIMETHICONE 80 MG PO CHEW
80.0000 mg | CHEWABLE_TABLET | Freq: Three times a day (TID) | ORAL | Status: DC
  Administered 2016-02-01 – 2016-02-04 (×8): 80 mg via ORAL
  Filled 2016-02-01 (×9): qty 1

## 2016-02-01 MED ORDER — OXYTOCIN 40 UNITS IN LACTATED RINGERS INFUSION - SIMPLE MED: 2.5000 [IU]/h | INTRAVENOUS | Status: AC

## 2016-02-01 MED ORDER — PHENYLEPHRINE 8 MG IN D5W 100 ML (0.08MG/ML) PREMIX OPTIME
INJECTION | INTRAVENOUS | Status: DC | PRN
  Administered 2016-02-01: 60 ug/min via INTRAVENOUS

## 2016-02-01 MED ORDER — MENTHOL 3 MG MT LOZG: 1.0000 | LOZENGE | OROMUCOSAL | Status: DC | PRN

## 2016-02-01 MED ORDER — ONDANSETRON HCL 4 MG/2ML IJ SOLN
INTRAMUSCULAR | Status: DC | PRN
  Administered 2016-02-01: 4 mg via INTRAVENOUS

## 2016-02-01 MED ORDER — KETOROLAC TROMETHAMINE 30 MG/ML IJ SOLN
INTRAMUSCULAR | Status: AC
  Filled 2016-02-01: qty 1

## 2016-02-01 MED ORDER — MORPHINE SULFATE-NACL 0.5-0.9 MG/ML-% IV SOSY
PREFILLED_SYRINGE | INTRAVENOUS | Status: AC
  Filled 2016-02-01: qty 1

## 2016-02-01 MED ORDER — CEFAZOLIN SODIUM-DEXTROSE 2-4 GM/100ML-% IV SOLN
2.0000 g | INTRAVENOUS | Status: AC
  Administered 2016-02-01: 2 g via INTRAVENOUS

## 2016-02-01 MED ORDER — DEXAMETHASONE SODIUM PHOSPHATE 10 MG/ML IJ SOLN
INTRAMUSCULAR | Status: AC
  Filled 2016-02-01: qty 1

## 2016-02-01 MED ORDER — ACETAMINOPHEN 325 MG PO TABS
650.0000 mg | ORAL_TABLET | ORAL | Status: DC | PRN
  Administered 2016-02-02 – 2016-02-04 (×6): 650 mg via ORAL
  Filled 2016-02-01 (×6): qty 2

## 2016-02-01 MED ORDER — SENNOSIDES-DOCUSATE SODIUM 8.6-50 MG PO TABS
2.0000 | ORAL_TABLET | ORAL | Status: DC
  Administered 2016-02-02 – 2016-02-03 (×2): 2 via ORAL
  Filled 2016-02-01 (×2): qty 2

## 2016-02-01 MED ORDER — LACTATED RINGERS IV SOLN
INTRAVENOUS | Status: DC
  Administered 2016-02-01: 19:00:00 via INTRAVENOUS

## 2016-02-01 MED ORDER — ZOLPIDEM TARTRATE 5 MG PO TABS
5.0000 mg | ORAL_TABLET | Freq: Every evening | ORAL | Status: DC | PRN
Start: 2016-02-01 — End: 2016-02-04

## 2016-02-01 MED ORDER — OXYCODONE HCL 5 MG PO TABS
10.0000 mg | ORAL_TABLET | ORAL | Status: DC | PRN
  Administered 2016-02-01 – 2016-02-02 (×4): 10 mg via ORAL
  Filled 2016-02-01 (×4): qty 2

## 2016-02-01 MED ORDER — DEXAMETHASONE SODIUM PHOSPHATE 10 MG/ML IJ SOLN
INTRAMUSCULAR | Status: DC | PRN
  Administered 2016-02-01: 5 mg via INTRAVENOUS

## 2016-02-01 MED ORDER — OXYTOCIN 10 UNIT/ML IJ SOLN
INTRAMUSCULAR | Status: AC
  Filled 2016-02-01: qty 4

## 2016-02-01 MED ORDER — SODIUM CHLORIDE 0.9 % IR SOLN
Status: DC | PRN
  Administered 2016-02-01: 1

## 2016-02-01 MED ORDER — ONDANSETRON HCL 4 MG/2ML IJ SOLN: 4.0000 mg | Freq: Once | INTRAMUSCULAR | Status: DC | PRN

## 2016-02-01 MED ORDER — ACETAMINOPHEN 10 MG/ML IV SOLN
1000.0000 mg | Freq: Once | INTRAVENOUS | Status: AC
  Administered 2016-02-01: 1000 mg via INTRAVENOUS
  Filled 2016-02-01: qty 100

## 2016-02-01 MED ORDER — ALBUMIN HUMAN 5 % IV SOLN
INTRAVENOUS | Status: AC
  Filled 2016-02-01: qty 250

## 2016-02-01 MED ORDER — KETOROLAC TROMETHAMINE 30 MG/ML IJ SOLN
30.0000 mg | Freq: Once | INTRAMUSCULAR | Status: AC
  Administered 2016-02-01: 30 mg via INTRAMUSCULAR

## 2016-02-01 MED ORDER — WITCH HAZEL-GLYCERIN EX PADS: 1.0000 "application " | MEDICATED_PAD | CUTANEOUS | Status: DC | PRN

## 2016-02-01 MED ORDER — SIMETHICONE 80 MG PO CHEW
80.0000 mg | CHEWABLE_TABLET | ORAL | Status: DC
  Administered 2016-02-01 – 2016-02-03 (×3): 80 mg via ORAL
  Filled 2016-02-01 (×3): qty 1

## 2016-02-01 MED ORDER — PHENYLEPHRINE HCL 10 MG/ML IJ SOLN
INTRAMUSCULAR | Status: DC | PRN
  Administered 2016-02-01 (×2): 40 ug via INTRAVENOUS

## 2016-02-01 MED ORDER — BUPIVACAINE IN DEXTROSE 0.75-8.25 % IT SOLN
INTRATHECAL | Status: DC | PRN
  Administered 2016-02-01: 1.6 mL via INTRATHECAL

## 2016-02-01 MED ORDER — SIMETHICONE 80 MG PO CHEW: 80.0000 mg | CHEWABLE_TABLET | ORAL | Status: DC | PRN

## 2016-02-01 MED ORDER — PHENYLEPHRINE 8 MG IN D5W 100 ML (0.08MG/ML) PREMIX OPTIME
INJECTION | INTRAVENOUS | Status: AC
  Filled 2016-02-01: qty 100

## 2016-02-01 MED ORDER — HYDROMORPHONE HCL 1 MG/ML IJ SOLN
1.0000 mg | INTRAMUSCULAR | Status: DC | PRN
  Administered 2016-02-01: 1 mg via INTRAVENOUS
  Filled 2016-02-01: qty 1

## 2016-02-01 MED ORDER — IBUPROFEN 600 MG PO TABS
600.0000 mg | ORAL_TABLET | Freq: Four times a day (QID) | ORAL | Status: DC
  Administered 2016-02-01 – 2016-02-04 (×12): 600 mg via ORAL
  Filled 2016-02-01 (×11): qty 1

## 2016-02-01 MED ORDER — ALBUMIN HUMAN 5 % IV SOLN
INTRAVENOUS | Status: DC | PRN
  Administered 2016-02-01: 11:00:00 via INTRAVENOUS

## 2016-02-01 MED ORDER — LACTATED RINGERS IV SOLN
INTRAVENOUS | Status: DC
  Administered 2016-02-01: 11:00:00 via INTRAVENOUS

## 2016-02-01 MED ORDER — FENTANYL CITRATE (PF) 100 MCG/2ML IJ SOLN
25.0000 ug | INTRAMUSCULAR | Status: DC | PRN
  Administered 2016-02-01: 25 ug via INTRAVENOUS

## 2016-02-01 MED ORDER — DIBUCAINE 1 % RE OINT: 1.0000 "application " | TOPICAL_OINTMENT | RECTAL | Status: DC | PRN

## 2016-02-01 MED ORDER — LACTATED RINGERS IV SOLN
Freq: Once | INTRAVENOUS | Status: AC
  Administered 2016-02-01: 1000 mL/h via INTRAVENOUS
  Administered 2016-02-01 (×2): via INTRAVENOUS

## 2016-02-01 MED ORDER — DOCUSATE SODIUM 100 MG PO CAPS
100.0000 mg | ORAL_CAPSULE | Freq: Two times a day (BID) | ORAL | Status: DC
  Administered 2016-02-01 – 2016-02-04 (×6): 100 mg via ORAL
  Filled 2016-02-01 (×6): qty 1

## 2016-02-01 MED ORDER — FAMOTIDINE 40 MG PO TABS
40.0000 mg | ORAL_TABLET | Freq: Every day | ORAL | Status: DC
  Administered 2016-02-02 – 2016-02-04 (×3): 40 mg via ORAL
  Filled 2016-02-01 (×5): qty 1

## 2016-02-01 MED ORDER — PRENATAL MULTIVITAMIN CH
1.0000 | ORAL_TABLET | Freq: Every day | ORAL | Status: DC
Start: 2016-02-01 — End: 2016-02-04
  Administered 2016-02-04: 1 via ORAL
  Filled 2016-02-01 (×2): qty 1

## 2016-02-01 MED ORDER — COCONUT OIL OIL
1.0000 | TOPICAL_OIL | Status: DC | PRN
Start: 2016-02-01 — End: 2016-02-04

## 2016-02-01 MED ORDER — OXYTOCIN 10 UNIT/ML IJ SOLN
INTRAVENOUS | Status: DC | PRN
  Administered 2016-02-01: 40 [IU] via INTRAVENOUS

## 2016-02-01 MED ORDER — SCOPOLAMINE 1 MG/3DAYS TD PT72
MEDICATED_PATCH | TRANSDERMAL | Status: AC
  Administered 2016-02-01: 1.5 mg via TRANSDERMAL
  Filled 2016-02-01: qty 1

## 2016-02-01 MED ORDER — SCOPOLAMINE 1 MG/3DAYS TD PT72
1.0000 | MEDICATED_PATCH | Freq: Once | TRANSDERMAL | Status: DC
  Administered 2016-02-01: 1.5 mg via TRANSDERMAL

## 2016-02-01 SURGICAL SUPPLY — 35 items
BENZOIN TINCTURE PRP APPL 2/3 (GAUZE/BANDAGES/DRESSINGS) ×3 IMPLANT
CHLORAPREP W/TINT 26ML (MISCELLANEOUS) ×3 IMPLANT
CLAMP CORD UMBIL (MISCELLANEOUS) IMPLANT
CLOSURE WOUND 1/2 X4 (GAUZE/BANDAGES/DRESSINGS) ×1
CLOTH BEACON ORANGE TIMEOUT ST (SAFETY) ×3 IMPLANT
CONTAINER PREFILL 10% NBF 15ML (MISCELLANEOUS) IMPLANT
DRSG OPSITE POSTOP 4X10 (GAUZE/BANDAGES/DRESSINGS) ×3 IMPLANT
ELECT REM PT RETURN 9FT ADLT (ELECTROSURGICAL) ×3
ELECTRODE REM PT RTRN 9FT ADLT (ELECTROSURGICAL) ×1 IMPLANT
EXTRACTOR VACUUM M CUP 4 TUBE (SUCTIONS) IMPLANT
EXTRACTOR VACUUM M CUP 4' TUBE (SUCTIONS)
GLOVE BIO SURGEON STRL SZ 6.5 (GLOVE) ×2 IMPLANT
GLOVE BIO SURGEONS STRL SZ 6.5 (GLOVE) ×1
GLOVE BIOGEL PI IND STRL 7.0 (GLOVE) ×2 IMPLANT
GLOVE BIOGEL PI INDICATOR 7.0 (GLOVE) ×4
GOWN STRL REUS W/TWL LRG LVL3 (GOWN DISPOSABLE) ×6 IMPLANT
KIT ABG SYR 3ML LUER SLIP (SYRINGE) IMPLANT
NEEDLE HYPO 22GX1.5 SAFETY (NEEDLE) IMPLANT
NEEDLE HYPO 25X5/8 SAFETYGLIDE (NEEDLE) IMPLANT
NS IRRIG 1000ML POUR BTL (IV SOLUTION) ×3 IMPLANT
PACK C SECTION WH (CUSTOM PROCEDURE TRAY) ×3 IMPLANT
PAD OB MATERNITY 4.3X12.25 (PERSONAL CARE ITEMS) ×3 IMPLANT
PENCIL SMOKE EVAC W/HOLSTER (ELECTROSURGICAL) ×3 IMPLANT
STRIP CLOSURE SKIN 1/2X4 (GAUZE/BANDAGES/DRESSINGS) ×2 IMPLANT
SUT MON AB 4-0 PS1 27 (SUTURE) ×3 IMPLANT
SUT PLAIN 0 NONE (SUTURE) IMPLANT
SUT PLAIN 2 0 XLH (SUTURE) IMPLANT
SUT VIC AB 0 CT1 36 (SUTURE) ×6 IMPLANT
SUT VIC AB 0 CTX 36 (SUTURE) ×4
SUT VIC AB 0 CTX36XBRD ANBCTRL (SUTURE) ×2 IMPLANT
SUT VIC AB 2-0 CT1 27 (SUTURE) ×2
SUT VIC AB 2-0 CT1 TAPERPNT 27 (SUTURE) ×1 IMPLANT
SYR CONTROL 10ML LL (SYRINGE) IMPLANT
TOWEL OR 17X24 6PK STRL BLUE (TOWEL DISPOSABLE) ×3 IMPLANT
TRAY FOLEY CATH SILVER 14FR (SET/KITS/TRAYS/PACK) IMPLANT

## 2016-02-01 NOTE — Anesthesia Postprocedure Evaluation (Signed)
Anesthesia Post Note  Patient: [name]  Procedure(s) Performed: Procedure(s) (LRB): Primary CESAREAN SECTION (N/A)  Patient location during evaluation: Mother Baby Anesthesia Type: Spinal Level of consciousness: awake and alert Pain management: pain level controlled Vital Signs Assessment: post-procedure vital signs reviewed and stable Respiratory status: spontaneous breathing and nonlabored ventilation Cardiovascular status: stable Postop Assessment: no headache, patient able to bend at knees, no backache, no signs of nausea or vomiting, adequate PO intake and spinal receding Anesthetic complications: no     Last Vitals:  Vitals:   02/01/16 1328 02/01/16 1500  BP: 103/60 106/62  Pulse: 61 62  Resp: 18 16  Temp: 36.7 C 36.8 C    Last Pain:  Vitals:   02/01/16 1500  TempSrc: Oral  PainSc: 0-No pain   Pain Goal: Patients Stated Pain Goal: 5 (02/01/16 0902)               Laban EmperorMalinova,Jathen Sudano Hristova

## 2016-02-01 NOTE — Progress Notes (Signed)
When attempting to ambulate patient, pt got dizzy when standing for a couple of minutes. Pt back in bed safely. St states she had some ringing in her ears but alert and oriented. Fundus firm and midline and bleeding scant. Notified Reina Fuseaniel Paul CNM, ordered to let pt rest and attempt ambulation later. Will continue to monitor.

## 2016-02-01 NOTE — Transfer of Care (Signed)
Immediate Anesthesia Transfer of Care Note  Patient: Lindsey Tucker  Procedure(s) Performed: Procedure(s) with comments: Primary CESAREAN SECTION (N/A) - EDD: 02/08/16 *Time ok per Izora Ribasoley*  Patient Location: PACU  Anesthesia Type:Spinal  Level of Consciousness: awake, alert  and oriented  Airway & Oxygen Therapy: Patient Spontanous Breathing  Post-op Assessment: Report given to RN and Post -op Vital signs reviewed and stable  Post vital signs: Reviewed and stable  Last Vitals:  Vitals:   02/01/16 0902 02/01/16 1135  BP: 99/71 (!) 88/62  Pulse: 95   Resp: 16   Temp: 36.7 C     Last Pain: There were no vitals filed for this visit.    Patients Stated Pain Goal: 5 (02/01/16 0902)  Complications: No apparent anesthesia complications

## 2016-02-01 NOTE — Addendum Note (Signed)
Addendum  created 02/01/16 1601 by Elgie CongoNataliya H Ticia Virgo, CRNA   Sign clinical note

## 2016-02-01 NOTE — Brief Op Note (Signed)
02/01/2016  11:37 AM  PATIENT:  Lindsey Tucker  30 y.o. female  PRE-OPERATIVE DIAGNOSIS:  Breech Presentation  POST-OPERATIVE DIAGNOSIS:  Breech Presentation  PROCEDURE:  Procedure(s) with comments: Primary CESAREAN SECTION (N/A) - EDD: 02/08/16 *Time ok per Izora Ribasoley*  SURGEON:  Surgeon(s) and Role:    * Noland FordyceKelly Dayne Chait, MD - Primary  PHYSICIAN ASSISTANT:   ASSISTANTS: Reina Fuseaniel Paul, CNM   ANESTHESIA:   spinal  EBL:  Total I/O In: 3450 [I.V.:3200; IV Piggyback:250] Out: 1400 [Urine:200; Blood:1200]  BLOOD ADMINISTERED: None  DRAINS: Urinary Catheter (Foley)   LOCAL MEDICATIONS USED:  NONE  SPECIMEN:  Source of Specimen:  Placenta  DISPOSITION OF SPECIMEN:  Labor and delivery  COUNTS:  YES  TOURNIQUET:  * No tourniquets in log *  DICTATION: .Note written in EPIC  PLAN OF CARE: Admit to inpatient   PATIENT DISPOSITION:  PACU - hemodynamically stable.   Delay start of Pharmacological VTE agent (>24hrs) due to surgical blood loss or risk of bleeding: yes

## 2016-02-01 NOTE — Lactation Note (Signed)
This note was copied from a baby's chart. Lactation Consultation Note:  Mother states that she breastfed her first child for 3 months. Lactation brochure given with basic teaching. Referred mother to Baby and Me book for teaching. Mother states that she is able to see colostrum when hand expressed. Mother states that infant is feeding well. She just finished a 25 min feeding. Mother will page to have latch checked with next feeding.   Patient Name: Girl Jeralene HuffSamantha Wescott ZOXWR'UToday's Date: 02/01/2016 Reason for consult: Initial assessment   Maternal Data    Feeding Feeding Type: Breast Fed Length of feed: 25 min  LATCH Score/Interventions Latch: Grasps breast easily, tongue down, lips flanged, rhythmical sucking.  Audible Swallowing: A few with stimulation  Type of Nipple: Everted at rest and after stimulation  Comfort (Breast/Nipple): Soft / non-tender     Hold (Positioning): No assistance needed to correctly position infant at breast.  LATCH Score: 9  Lactation Tools Discussed/Used     Consult Status Consult Status: Follow-up Date: 02/01/16 Follow-up type: In-patient    Stevan BornKendrick, Elisabet [facility] 02/01/2016, 4:04 PM

## 2016-02-01 NOTE — H&P (Signed)
Lindsey Tucker is a 30 y.o. G2P1001 at 7054w0d [facility], despite attempt at version. Pt notes no contractions. Good fetal movement, No vaginal bleeding, not leaking fluid   [facility] - h/o anxiety, well controlled w/o meds this preg - breech, failed attempt at ECV - GBS pos   Prenatal Transfer Tool  Maternal Diabetes: No Genetic Screening: Normal Maternal Ultrasounds/Referrals: Normal Fetal Ultrasounds or other Referrals:  None Maternal Substance Abuse:  No Significant Maternal Medications:  None Significant Maternal Lab Results: None     OB History    Gravida Para Term Preterm AB Living   2 1 1     1    SAB TAB Ectopic Multiple Live Births           1     Past Medical History:  Diagnosis Date  . Asthma   . GERD (gastroesophageal reflux disease)   . Scoliosis   . Vacuum extractor delivery, delivered (12/12) 06/01/2013   Past Surgical History:  Procedure Laterality Date  . SPINAL GROWTH RODS  2000, 2003  . WISDOM TOOTH EXTRACTION     Family History: family history includes Cancer in her maternal grandfather and paternal grandmother; Diabetes in her maternal grandfather; Hypertension in her father and mother. Social History:  reports that she has never smoked. She has never used smokeless tobacco. She reports that she drinks about 1.2 oz of alcohol per week . She reports that she does not use drugs.  Review of Systems - Negative except discomfort of pregnancy     Blood pressure 99/71, pulse 95, temperature 98.1 F (36.7 C), resp. rate 16, SpO2 100 %, unknown if currently breastfeeding.  Physical Exam:  Gen: well appearing, no distress  Abd: gravid, NT, no RUQ pain LE: no edema, equal bilaterally, non-tender  RTUS: footling breech  Prenatal labs: ABO, Rh: --/--/O POS (08/11 1045) Antibody: NEG (08/11 1045) Rubella: !Error! immune RPR: Non Reactive (08/11 1045)  HBsAg: Negative (01/25 0000)   HIV: Non-reactive (01/25 0000)  GBS: Positive (03/09 0000)  1 hr Glucola normal  Genetic screening normal Anatomy US normal   Assessment/Plan: 30 y.o. G2P1001 at 6354w0d Persistent breech. PCS. R/B d/w pt. GBS pos anxiety   Lindsey Tucker A. 02/01/2016, 10:03 AM

## 2016-02-01 NOTE — Anesthesia Procedure Notes (Signed)
Spinal  Patient location during procedure: OR Staffing Anesthesiologist: Karie SchwalbeJUDD, Lynnsey Barbara Performed: anesthesiologist  Preanesthetic Checklist Completed: patient identified, site marked, surgical consent, pre-op evaluation, timeout performed, IV checked, risks and benefits discussed and monitors and equipment checked Spinal Block Patient position: sitting Prep: DuraPrep Patient monitoring: continuous pulse ox, blood pressure and heart rate Approach: midline Location: L4-5 Injection technique: single-shot Needle Needle type: Spinocan  Needle gauge: 24 G Needle length: 9 cm Additional Notes Functioning IV was confirmed and monitors were applied. Sterile prep and drape, including hand hygiene, mask and sterile gloves were used. The patient was positioned and the spine was prepped. The skin was anesthetized with lidocaine.  Free flow of clear CSF was obtained prior to injecting local anesthetic into the CSF.  The spinal needle aspirated freely following injection.  The needle was carefully withdrawn.  The patient tolerated the procedure well. Consent was obtained prior to procedure with all questions answered and concerns addressed. Risks including but not limited to bleeding, infection, nerve damage, paralysis, failed block, inadequate analgesia, allergic reaction, high spinal, itching and headache were discussed and the patient wished to proceed.   Karie SchwalbeMary Dari Carpenito, MD  On first pass, patient had paresthesia on L hip. Stopped withdrew needle, redirected right and on next pass, obtained CSF easily and patient comfortable. Straightforward spinal placement

## 2016-02-01 NOTE — Op Note (Signed)
02/01/2016  11:37 AM  PATIENT:  Lindsey Tucker  30 y.o. female  PRE-OPERATIVE DIAGNOSIS:  Breech Presentation  POST-OPERATIVE DIAGNOSIS:  Breech Presentation  PROCEDURE:  Procedure(s) with comments: Primary CESAREAN SECTION (N/A) - EDD: 02/08/16 *Time ok per Izora Ribasoley*  SURGEON:  Surgeon(s) and Role:    * Noland FordyceKelly Sarinah Doetsch, MD - Primary  PHYSICIAN ASSISTANT:   ASSISTANTS: Reina Fuseaniel Paul, CNM   ANESTHESIA:   spinal  EBL:  Total I/O In: 3450 [I.V.:3200; IV Piggyback:250] Out: 1400 [Urine:200; Blood:1200]  BLOOD ADMINISTERED: None  DRAINS: Urinary Catheter (Foley)   LOCAL MEDICATIONS USED:  NONE  SPECIMEN:  Source of Specimen:  Placenta  DISPOSITION OF SPECIMEN:  Labor and delivery  COUNTS:  YES  TOURNIQUET:  * No tourniquets in log *  DICTATION: .Note written in EPIC  PLAN OF CARE: Admit to inpatient   PATIENT DISPOSITION:  PACU - hemodynamically stable.   Delay start of Pharmacological VTE agent (>24hrs) due to surgical blood loss or risk of bleeding: yes     Findings:  @BABYSEXEBC @ infant,  APGAR (1 MIN):   APGAR (5 MINS):   APGAR (10 MINS):   Normal uterus, tubes and ovaries, normal placenta. 3VC, clear amniotic fluid, double footling breech  EBL: 1200 cc Antibiotics:   2g Ancef Complications: none  Indications: This is a [age] y.o. year-old, G2 P1  At 247w0d admitted for primary cesarean section for persistent breech presentation despite attempt at external cephalic version. Risks benefits and alternatives of the procedure were discussed with the patient who agreed to proceed  Procedure:  After informed consent was obtained the patient was taken to the operating room where spinal anesthesia was initiated without difficulty despite patient's high-risk prior back surgeries.  She was prepped and draped in the normal sterile fashion in dorsal supine position with a leftward tilt.  A foley catheter was in place.  A Pfannenstiel skin incision was made 2 cm above the pubic  symphysis in the midline with the scalpel.  Dissection was carried down with the Bovie cautery until the fascia was reached. The fascia was incised in the midline. The incision was extended laterally with the Mayo scissors. The inferior aspect of the fascial incision was grasped with the Coker clamps, elevated up and the underlying rectus muscles were dissected off sharply. The superior aspect of the fascial incision was grasped with the Coker clamps elevated up and the underlying rectus muscles were dissected off sharply.  The peritoneum was entered sharply. The peritoneal incision was extended superiorly and inferiorly with good visualization of the bladder. The bladder blade was inserted and palpation was done to assess the fetal position and the location of the uterine vessels. A bladder flap was created sharply. The lower segment of the uterus was incised sharply with the scalpel and extended  bluntly in the cephalo-caudal fashion. The infants feet were grasped brought to the incision and the baby spontaneously delivered to the shoulders the left than the right shoulders were delivered with rotation and the head was delivered in flexion. The nose and mouth were bulb suctioned. Delayed cord clamping for about 50 seconds was done. We did not allow to fulminant due to uterine bleeding. During the time of delayed cord clamping the uterus was assessed and clamps were placed on the left angle of the uterine incision where brisk bleeding was noted from a large vessel. The cord was clamped and cut. The infant was handed off to the waiting pediatrician. The placenta was expressed. The uterus was  exteriorized. Additional clamps were placed on the left angle due to the brisk bleeding. The uterus was cleared of all clots and debris. A focal area on the posterior wall mid uterus just to the right of midline was bleeding quite briskly. No placental tissue or membranes were noted. The endometrium seemed irregular and bleeding  more than normal. 2 figure-of-eight sutures were placed to stop this bleeding of unclear etiology. 0 Vicryl suture was used The uterine incision was repaired with 0 Vicryl in a running locked fashion.  A small hematoma was building at the left angle and additional sutures were used to control this . A second layer of the same suture was used in an imbricating fashion to obtain excellent hemostasis.  The uterus was then returned to the abdomen, the gutters were cleared of all clots and debris. The uterine incision was reinspected and found to be hemostatic. The peritoneum was grasped and closed with 2-0 Vicryl in a running fashion. The cut muscle edges and the underside of the fascia were inspected and found to be hemostatic. The fascia was closed with 0 Vicryl in two halves . The subcutaneous tissue was irrigated. Scarpa's layer was closed with a 2-0 plain gut suture. The skin was closed with a 4-0 Monocryl in a single layer. The patient tolerated the procedure well. Sponge lap and needle counts were correct x3 and patient was taken to the recovery room in a stable condition.  Eligio Angert A. 02/01/2016 11:38 AM

## 2016-02-01 NOTE — Anesthesia Postprocedure Evaluation (Signed)
Anesthesia Post Note  Patient: Abbott PaoSamantha R Aspinwall  Procedure(s) Performed: Procedure(s) (LRB): Primary CESAREAN SECTION (N/A)  Patient location during evaluation: PACU Anesthesia Type: Spinal Level of consciousness: oriented and awake and alert Pain management: pain level controlled Vital Signs Assessment: post-procedure vital signs reviewed and stable Respiratory status: spontaneous breathing, respiratory function stable and patient connected to nasal cannula oxygen Cardiovascular status: blood pressure returned to baseline and stable Postop Assessment: no headache, no backache, spinal receding and patient able to bend at knees Anesthetic complications: no Comments: VSS, no signs of bleeding per PACU nursing staff     Last Vitals:  Vitals:   02/01/16 1328 02/01/16 1500  BP: 103/60 106/62  Pulse: 61 62  Resp: 18 16  Temp: 36.7 C 36.8 C    Last Pain:  Vitals:   02/01/16 1500  TempSrc: Oral  PainSc: 0-No pain   Pain Goal: Patients Stated Pain Goal: 5 (02/01/16 0902)               Juliana Boling JENNETTE

## 2016-02-02 ENCOUNTER — Encounter (HOSPITAL_COMMUNITY): Payer: Self-pay | Admitting: Obstetrics and Gynecology

## 2016-02-02 LAB — CBC
HCT: 24.6 % — ABNORMAL LOW (ref 36.0–46.0)
HEMOGLOBIN: 8.7 g/dL — AB (ref 12.0–15.0)
MCH: 30.7 pg (ref 26.0–34.0)
MCHC: 35.4 g/dL (ref 30.0–36.0)
MCV: 86.9 fL (ref 78.0–100.0)
PLATELETS: 184 10*3/uL (ref 150–400)
RBC: 2.83 MIL/uL — AB (ref 3.87–5.11)
RDW: 15.2 % (ref 11.5–15.5)
WBC: 12.2 10*3/uL — AB (ref 4.0–10.5)

## 2016-02-02 LAB — BIRTH TISSUE RECOVERY COLLECTION (PLACENTA DONATION)

## 2016-02-02 NOTE — Lactation Note (Signed)
This note was copied from a baby's chart. Lactation Consultation Note  Due to increased bilirubin Peds MD ordered supplementation w/ formula after breastfeeding. Parents were in agreement w/ formula supplementation since their older child has to stay in hospital for 5 days w/ phototherapy. Spoke w/ parents and they want to supplement w/ SNS at the breast. Baby latches easily but needed help to increase depth.  After latching applied SNS tubing and baby was slow to take volume. Baby took 5 ml with single SNS.  Switched to 5 fr and baby took an additional 13 ml.  Baby breastfed 1 hr ago for 60 min. Helped mother flange bottom lip.  Baby fell asleep after 45 min breastfeeding and 18 ml of formula. Set up DEBP and encouraged mother to post pump for 10-15 min 4-5 times a day. Taught parents how to finger syringe feed if needed. Encouraged parents to call if they need further assistance.  Patient Name: [name] ZOXWR'UToday's Date: 02/02/2016 Reason for consult: Follow-up assessment   Maternal Data    Feeding Feeding Type: Breast Fed Length of feed: 45 min  LATCH Score/Interventions Latch: Grasps breast easily, tongue down, lips flanged, rhythmical sucking. Intervention(s): Adjust position;Breast compression;Breast massage  Audible Swallowing: A few with stimulation  Type of Nipple: Everted at rest and after stimulation  Comfort (Breast/Nipple): Soft / non-tender     Hold (Positioning): No assistance needed to correctly position infant at breast.  LATCH Score: 9  Lactation Tools Discussed/Used Tools: Supplemental Nutrition System;67F feeding tube / Syringe Pump Review: Setup, frequency, and cleaning;Milk Storage Initiated by:: Dahlia Byesuth Dene Landsberg RN Date initiated:: 02/02/16   Consult Status Consult Status: Follow-up Date: 02/03/16 Follow-up type: In-patient    Dahlia ByesBerkelhammer, Solaris Kram [facility] 02/02/2016, 4:32 PM

## 2016-02-02 NOTE — Progress Notes (Signed)
Patient ID: Lindsey Tucker, female   DOB: Feb 25, 1986, 30 y.o.   MRN: [id number] Subjective: S/P Primary Cesarean Delivery for Breech Presentation POD# 1 Information for the patient's newborn:  Lindsey Tucker, Lindsey Tucker [[id number]][[id number]]  female   Reports feeling well. Feeding: breast Patient reports tolerating PO.  Breast symptoms: none Pain controlled with ibuprofen (OTC) and narcotic analgesics including oxycodone (Oxycontin, Oxyir) Denies HA/SOB/C/P/N/V/dizziness. Flatus present. No BM. She reports vaginal bleeding as normal, without clots.  She is ambulating, urinating without difficult.     Objective:   VS:  Vitals:   02/01/16 1500 02/01/16 2058 02/01/16 2354 02/02/16 0440  BP: 106/62   111/67  Pulse: 62   75  Resp: 16 18 18 16   Temp: 98.2 F (36.8 C) 98.1 F (36.7 C) 98.6 F (37 C) 98.2 F (36.8 C)  TempSrc: Oral Oral Oral Oral  SpO2: 100% 93%    Weight:      Height:         Intake/Output Summary (Last 24 hours) at 02/02/16 [id number] Last data filed at 02/02/16 0450  Gross per 24 hour  Intake             3890 ml  Output             5000 ml  Net            -1110 ml        Recent Labs  02/02/16 0556  WBC 12.2*  HGB 8.7*  HCT 24.6*  PLT 184     Blood type: --/--/O POS (08/11 1045)  Rubella: Immune (01/25 0000)     Physical Exam:   General: alert, cooperative and no distress  CV: Regular rate and rhythm, S1S2 present or without murmur or extra heart sounds  Resp: clear  Abdomen: soft, nontender, normal bowel sounds  Incision: clean, dry and intact  Uterine Fundus: firm, 1 FB below umbilicus, nontender  Lochia: minimal  Ext: extremities normal, atraumatic, no cyanosis or edema, Homans sign is negative, no sign of DVT and no edema, redness or tenderness in the calves or thighs   Assessment/Plan: 30 y.o.   POD# 1.  S/P Cesarean Delivery.  Indications: breech                Principal Problem:   Postpartum care following cesarean delivery (8/14) Active Problems:  Breech presentation delivered  Doing well, stable.               Regular diet as tolerated D/C IV this AM Ambulate Routine post-op care  Kenard GowerAWSON, Zyia Kaneko, M, MSN, CNM 02/02/2016, 8:14 AM

## 2016-02-03 MED ORDER — VITAMIN C 500 MG PO TABS
500.0000 mg | ORAL_TABLET | Freq: Every day | ORAL | Status: DC
  Administered 2016-02-03 – 2016-02-04 (×2): 500 mg via ORAL
  Filled 2016-02-03 (×3): qty 1

## 2016-02-03 NOTE — Progress Notes (Signed)
abd distended    Instructed her she has got to get oob and walk in halls    Drink warm beverages   But she has not gotten out in hallways or not much oob

## 2016-02-03 NOTE — Progress Notes (Signed)
Patient ID: Abbott PaoSamantha R Beddow, female   DOB: [birth year], 30 y.o.   MRN: [id number] Subjective: POD# 2 Information for the patient's newborn:  Con Memosshby, Girl Angelice [[id number]][[id number]]  female  Baby name: Joycie PeekJUNE  Reports feeling better today, has decreased use of narcotics, only on NSAID and APAP. Advised can use half dose of oxycodone as needed. Feeding: breast and S&S formula, baby on double light phototherapy. Patient reports tolerating PO.  Breast symptoms: blisters on L nipple, working w/ LC for latch and feeding corrections.  Denies HA/SOB/C/P/N/V/dizziness. Flatus present. She reports vaginal bleeding as normal, without clots.  She is ambulating more today, urinating without difficulty.     Objective:   VS:  Vitals:   02/02/16 0440 02/02/16 0930 02/02/16 1800 02/03/16 0523  BP: 111/67 104/60 100/62 (!) 94/52  Pulse: 75 80 75 74  Resp: 16 16 18 18   Temp: 98.2 F (36.8 C) 98.5 F (36.9 C) 99.2 F (37.3 C) 98.2 F (36.8 C)  TempSrc: Oral  Oral   SpO2:  98%    Weight:      Height:         Intake/Output Summary (Last 24 hours) at 02/03/16 0936 Last data filed at 02/02/16 1000  Gross per 24 hour  Intake              360 ml  Output              800 ml  Net             -440 ml        Recent Labs  02/02/16 0556  WBC 12.2*  HGB 8.7*  HCT 24.6*  PLT 184     Blood type: --/--/O POS (08/11 1045)  Rubella: Immune (01/25 0000)     Physical Exam:  General: alert, cooperative and no distress CV: Regular rate and rhythm Resp: clear Abdomen: soft, nontender, normal bowel sounds Incision: clean, dry and intact Uterine Fundus: firm, below umbilicus, nontender Lochia: minimal Ext: extremities normal, atraumatic, no cyanosis or edema      Assessment/Plan: 30 y.o.   POD# 2. [id number]                  Principal Problem:   Postpartum care following cesarean delivery (8/14) Active Problems:   Breech presentation delivered Acute blood loss anemia  asymptomatic  Doing well,  stable.    Started oral Fe supplement          Advance diet as tolerated Encourage rest when baby rests Breastfeeding support Encourage to ambulate Routine post-op care Anticipate DC in AM  Neta Mendsaniela C Clotile Whittington, CNM, MSN 02/03/2016, 9:36 AM

## 2016-02-03 NOTE — Lactation Note (Signed)
This note was copied from a baby's chart. Lactation Consultation Note  Baby latched in cradle hold on L side at the end of a 30 min feeding. Mother's breasts are filling.  She recently pumped 30 ml of breastmilk. Discussed sleepy baby and the need to continue to supplement w/ pumped breastmilk or formula until baby is off phototherapy, weight stabilized and increased stools. Explained that it may appear that baby is latched well but she may not be transferring as much when she is sleepy. Continue to monitor voids/stools.   Encouraged mother to watch for swallows.  Increase supplementation as baby desires. Discussed being proactive if baby is sleepy for feedings.  Reviewed waking techniques.   Patient Name: Girl Jeralene HuffSamantha Shough ZOXWR'UToday's Date: 02/03/2016 Reason for consult: Follow-up assessment   Maternal Data    Feeding Feeding Type: Breast Fed (latched upon entering) Length of feed: 30 min  LATCH Score/Interventions Latch: Grasps breast easily, tongue down, lips flanged, rhythmical sucking.  Audible Swallowing: A few with stimulation  Type of Nipple: Everted at rest and after stimulation  Comfort (Breast/Nipple): Filling, red/small blisters or bruises, mild/mod discomfort  Problem noted: Mild/Moderate discomfort Interventions (Mild/moderate discomfort):  (#21 pump flanges)  Hold (Positioning): No assistance needed to correctly position infant at breast.  LATCH Score: 8  Lactation Tools Discussed/Used     Consult Status Consult Status: Follow-up Date: 02/04/16 Follow-up type: In-patient    Dahlia ByesBerkelhammer, Federick Levene [facility] 02/03/2016, 5:59 PM

## 2016-02-04 MED ORDER — IBUPROFEN 600 MG PO TABS: 600.0000 mg | ORAL_TABLET | Freq: Four times a day (QID) | ORAL | 0 refills | Status: DC

## 2016-02-04 MED ORDER — IBUPROFEN 800 MG PO TABS: 800.0000 mg | ORAL_TABLET | Freq: Three times a day (TID) | ORAL | 1 refills | Status: DC | PRN

## 2016-02-04 NOTE — Discharge Instructions (Signed)
Breast Pumping Tips °If you are breastfeeding, there may be times when you cannot feed your baby directly. Returning to work or going on a trip are common examples. Pumping allows you to store breast milk and feed it to your baby later.  °You may not get much milk when you first start to pump. Your breasts should start to make more after a few days. If you pump at the times you usually feed your baby, you may be able to keep making enough milk to feed your baby without also using formula. The more often you pump, the more milk you will produce.  °WHEN SHOULD I PUMP?  °· You can begin to pump soon after delivery. However, some experts recommend waiting about 4 weeks before giving your infant a bottle to make sure breastfeeding is going well.  °· If you plan to return to work, begin pumping a few weeks before. This will help you develop techniques that work best for you. It also lets you build up a supply of breast milk.   °· When you are with your infant, feed on demand and pump after each feeding.   °· When you are away from your infant for several hours, pump for about 15 minutes every 2-3 hours. Pump both breasts at the same time if you can.   °· If your infant has a formula feeding, make sure to pump around the same time.     °· If you drink any alcohol, wait 2 hours before pumping.   °HOW DO I PREPARE TO PUMP? °Your let-down reflex is the natural reaction to stimulation that makes your breast milk flow. It is easier to stimulate this reflex when you are relaxed. Find relaxation techniques that work for you. If you have difficulty with your let-down reflex, try these methods:  °· Smell one of your infant's blankets or an item of clothing.   °· Look at a picture or video of your infant.   °· Sit in a quiet, private space.   °· Massage the breast you plan to pump.   °· Place soothing warmth on the breast.   °· Play relaxing music.   °WHAT ARE SOME GENERAL BREAST PUMPING TIPS? °· Wash your hands before you pump. You  do not need to wash your nipples or breasts. °· There are three ways to pump. °· You can use your hand to massage and compress your breast. °· You can use a handheld manual pump. °· You can use an electric pump.   °· Make sure the suction cup (flange) on the breast pump is the right size. Place the flange directly over the nipple. If it is the wrong size or placed the wrong way, it may be painful and cause nipple damage.   °· If pumping is uncomfortable, apply a small amount of purified or modified lanolin to your nipple and areola. °· If you are using an electric pump, adjust the speed and suction power to be more comfortable. °· If pumping is painful or if you are not getting very much milk, you may need a different type of pump. A lactation consultant can help you determine what type of pump to use.   °· Keep a full water bottle near you at all times. Drinking lots of fluid helps you make more milk.  °· You can store your milk to use later. Pumped breast milk can be stored in a sealable, sterile container or plastic bag. Label all stored breast milk with the date you pumped it. °· Milk can stay out at room temperature for up to 8 hours. °·   You can store your milk in the refrigerator for up to 8 days. °· You can store your milk in the freezer for 3 months. Thaw frozen milk using warm water. Do not put it in the microwave. °· Do not smoke. Smoking can lower your milk supply and harm your infant. If you need help quitting, ask your health care provider to recommend a program.   °WHEN SHOULD I CALL MY HEALTH CARE PROVIDER OR A LACTATION CONSULTANT? °· You are having trouble pumping. °· You are concerned that you are not making enough milk. °· You have nipple pain, soreness, or redness. °· You want to use birth control. Birth control pills may lower your milk supply. Talk to your health care provider about your options. °  °This information is not intended to replace advice given to you by your health care provider.  Make sure you discuss any questions you have with your health care provider. °  °Document Released: 11/24/2009 Document Revised: 06/11/2013 Document Reviewed: 03/29/2013 °[employer]vier Interactive Patient Education ©2016 [employer] °Postpartum Depression and Baby Blues °The postpartum period begins right after the birth of a baby. During this time, there is often a great amount of joy and excitement. It is also a time of many changes in the life of the parents. Regardless of how many times a mother gives birth, each child brings new challenges and dynamics to the family. It is not unusual to have feelings of excitement along with confusing shifts in moods, emotions, and thoughts. All mothers are at risk of developing postpartum depression or the "baby blues." These mood changes can occur right after giving birth, or they may occur many months after giving birth. The baby blues or postpartum depression can be mild or severe. Additionally, postpartum depression can go away rather quickly, or it can be a long-term condition.  °CAUSES °Raised hormone levels and the rapid drop in those levels are thought to be a main cause of postpartum depression and the baby blues. A number of hormones change during and after pregnancy. Estrogen and progesterone usually decrease right after the delivery of your baby. The levels of thyroid hormone and various cortisol steroids also rapidly drop. Other factors that play a role in these mood changes include major life events and genetics.  °RISK FACTORS °If you have any of the following risks for the baby blues or postpartum depression, know what symptoms to watch out for during the postpartum period. Risk factors that may increase the likelihood of getting the baby blues or postpartum depression include: °· Having a personal or family history of depression.   °· Having depression while being pregnant.   °· Having premenstrual mood issues or mood issues related to oral  contraceptives. °· Having a lot of life stress.   °· Having marital conflict.   °· Lacking a social support network.   °· Having a baby with special needs.   °· Having health problems, such as diabetes.   °SIGNS AND SYMPTOMS °Symptoms of baby blues include: °· Brief changes in mood, such as going from extreme happiness to sadness. °· Decreased concentration.   °· Difficulty sleeping.   °· Crying spells, tearfulness.   °· Irritability.   °· Anxiety.   °Symptoms of postpartum depression typically begin within the first month after giving birth. These symptoms include: °· Difficulty sleeping or excessive sleepiness.   °· Marked weight loss.   °· Agitation.   °· Feelings of worthlessness.   °· Lack of interest in activity or food.   °Postpartum psychosis is a very serious condition and can be dangerous. Fortunately, it is   rare. Displaying any of the following symptoms is cause for immediate medical attention. Symptoms of postpartum psychosis include:  °· Hallucinations and delusions.   °· Bizarre or disorganized behavior.   °· Confusion or disorientation.   °DIAGNOSIS  °A diagnosis is made by an evaluation of your symptoms. There are no medical or lab tests that lead to a diagnosis, but there are various questionnaires that a health care provider may use to identify those with the baby blues, postpartum depression, or psychosis. Often, a screening tool called the Edinburgh Postnatal Depression Scale is used to diagnose depression in the postpartum period.  °TREATMENT °The baby blues usually goes away on its own in 1-2 weeks. Social support is often all that is needed. You will be encouraged to get adequate sleep and rest. Occasionally, you may be given medicines to help you sleep.  °Postpartum depression requires treatment because it can last several months or longer if it is not treated. Treatment may include individual or group therapy, medicine, or both to address any social, physiological, and psychological factors  that may play a role in the depression. Regular exercise, a healthy diet, rest, and social support may also be strongly recommended.  °Postpartum psychosis is more serious and needs treatment right away. Hospitalization is often needed. °HOME CARE INSTRUCTIONS °· Get as much rest as you can. Nap when the baby sleeps.   °· Exercise regularly. Some women find yoga and walking to be beneficial.   °· Eat a balanced and nourishing diet.   °· Do little things that you enjoy. Have a cup of tea, take a bubble bath, read your favorite magazine, or listen to your favorite music. °· Avoid alcohol.   °· Ask for help with household chores, cooking, grocery shopping, or running errands as needed. Do not try to do everything.   °· Talk to people close to you about how you are feeling. Get support from your partner, family members, friends, or other new moms. °· Try to stay positive in how you think. Think about the things you are grateful for.   °· Do not spend a lot of time alone.   °· Only take over-the-counter or prescription medicine as directed by your health care provider. °· Keep all your postpartum appointments.   °· Let your health care provider know if you have any concerns.   °SEEK MEDICAL CARE IF: °You are having a reaction to or problems with your medicine. °SEEK IMMEDIATE MEDICAL CARE IF: °· You have suicidal feelings.   °· You think you may harm the baby or someone else. °MAKE SURE YOU: °· Understand these instructions. °· Will watch your condition. °· Will get help right away if you are not doing well or get worse. °  °This information is not intended to replace advice given to you by your health care provider. Make sure you discuss any questions you have with your health care provider. °  °Document Released: 03/10/2004 Document Revised: 06/11/2013 Document Reviewed: 03/18/2013 °Elsevier Interactive Patient Education ©2016 [employer] °Postpartum Care After Cesarean Delivery °After you deliver your newborn  (postpartum period), the usual stay in the hospital is 24-72 hours. If there were problems with your labor or delivery, or if you have other medical problems, you might be in the hospital longer.  °While you are in the hospital, you will receive help and instructions on how to care for yourself and your newborn during the postpartum period.  °While you are in the hospital: °· It is normal for you to have pain or discomfort from the incision in your   abdomen. Be sure to tell your nurses when you are having pain, where the pain is located, and what makes the pain worse. °· If you are breastfeeding, you may feel uncomfortable contractions of your uterus for a couple of weeks. This is normal. The contractions help your uterus get back to normal size. °· It is normal to have some bleeding after delivery. °· For the first 1-3 days after delivery, the flow is red and the amount may be similar to a period. °· It is common for the flow to start and stop. °· In the first few days, you may pass some small clots. Let your nurses know if you begin to pass large clots or your flow increases. °· Do not  flush blood clots down the toilet before having the nurse look at them. °· During the next 3-10 days after delivery, your flow should become more watery and pink or brown-tinged in color. °· Ten to fourteen days after delivery, your flow should be a small amount of yellowish-white discharge. °· The amount of your flow will decrease over the first few weeks after delivery. Your flow may stop in 6-8 weeks. Most women have had their flow stop by 12 weeks after delivery. °· You should change your sanitary pads frequently. °· Wash your hands thoroughly with soap and water for at least 20 seconds after changing pads, using the toilet, or before holding or feeding your newborn. °· Your intravenous (IV) tubing will be removed when you are drinking enough fluids. °· The urine drainage tube (urinary catheter) that was inserted before delivery  may be removed within 6-8 hours after delivery or when feeling returns to your legs. You should feel like you need to empty your bladder within the first 6-8 hours after the catheter has been removed. °· In case you become weak, lightheaded, or faint, call your nurse before you get out of bed for the first time and before you take a shower for the first time. °· Within the first few days after delivery, your breasts may begin to feel tender and full. This is called engorgement. Breast tenderness usually goes away within 48-72 hours after engorgement occurs. You may also notice milk leaking from your breasts. If you are not breastfeeding, do not stimulate your breasts. Breast stimulation can make your breasts produce more milk. °· Spending as much time as possible with your newborn is very important. During this time, you and your newborn can feel close and get to know each other. Having your newborn stay in your room (rooming in) will help to strengthen the bond with your newborn. It will give you time to get to know your newborn and become comfortable caring for your newborn. °· Your hormones change after delivery. Sometimes the hormone changes can temporarily cause you to feel sad or tearful. These feelings should not last more than a few days. If these feelings last longer than that, you should talk to your caregiver. °· If desired, talk to your caregiver about methods of family planning or contraception. °· Talk to your caregiver about immunizations. Your caregiver may want you to have the following immunizations before leaving the hospital: °· Tetanus, diphtheria, and pertussis (Tdap) or tetanus and diphtheria (Td) immunization. It is very important that you and your family (including grandparents) or others caring for your newborn are up-to-date with the Tdap or Td immunizations. The Tdap or Td immunization can help protect your newborn from getting ill. °· Rubella immunization. °· Varicella (chickenpox)    immunization. °· Influenza immunization. You should receive this annual immunization if you did not receive the immunization during your pregnancy. °  °This information is not intended to replace advice given to you by your health care provider. Make sure you discuss any questions you have with your health care provider. °  °Document Released: 02/29/2012 Document Reviewed: 02/29/2012 °[employer]vier Interactive Patient Education ©2016 [employer] °Breastfeeding and Mastitis °Mastitis is inflammation of the breast tissue. It can occur in women who are breastfeeding. This can make breastfeeding painful. Mastitis will sometimes go away on its own. Your health care provider will help determine if treatment is needed. °CAUSES °Mastitis is often associated with a blocked milk (lactiferous) duct. This can happen when too much milk builds up in the breast. Causes of excess milk in the breast can include: °· Poor latch-on. If your baby is not latched onto the breast properly, she or he may not empty your breast completely while breastfeeding. °· Allowing too much time to pass between feedings. °· Wearing a bra or other clothing that is too tight. This puts extra pressure on the lactiferous ducts so milk does not flow through them as it should. °Mastitis can also be caused by a bacterial infection. Bacteria may enter the breast tissue through cuts or openings in the skin. In women who are breastfeeding, this may occur because of cracked or irritated skin. Cracks in the skin are often caused when your baby does not latch on properly to the breast. °SIGNS AND SYMPTOMS °· Swelling, redness, tenderness, and pain in an area of the breast. °· Swelling of the glands under the arm on the same side. °· Fever may or may not accompany mastitis. °If an infection is allowed to progress, a collection of pus (abscess) may develop. °DIAGNOSIS  °Your health care provider can usually diagnose mastitis based on your symptoms and a physical exam.  Tests may be done to help confirm the diagnosis. These may include: °· Removal of pus from the breast by applying pressure to the area. This pus can be examined in the lab to determine which bacteria are present. If an abscess has developed, the fluid in the abscess can be removed with a needle. This can also be used to confirm the diagnosis and determine the bacteria present. In most cases, pus will not be present. °· Blood tests to determine if your body is fighting a bacterial infection. °· Mammogram or ultrasound tests to rule out other problems or diseases. °TREATMENT  °Mastitis that occurs with breastfeeding will sometimes go away on its own. Your health care provider may choose to wait 24 hours after first seeing you to decide whether a prescription medicine is needed. If your symptoms are worse after 24 hours, your health care provider will likely prescribe an antibiotic medicine to treat the mastitis. He or she will determine which bacteria are most likely causing the infection and will then select an appropriate antibiotic medicine. This is sometimes changed based on the results of tests performed to identify the bacteria, or if there is no response to the antibiotic medicine selected. Antibiotic medicines are usually given by mouth. You may also be given medicine for pain. °HOME CARE INSTRUCTIONS °· Only take over-the-counter or prescription medicines for pain, fever, or discomfort as directed by your health care provider. °· If your health care provider prescribed an antibiotic medicine, take the medicine as directed. Make sure you finish it even if you start to feel better. °· Do not wear a   tight or underwire bra. Wear a soft, supportive bra. °· Increase your fluid intake, especially if you have a fever. °· Continue to empty the breast. Your health care provider can tell you whether this milk is safe for your infant or needs to be thrown out. You may be told to stop nursing until your health care  provider thinks it is safe for your baby. Use a breast pump if you are advised to stop nursing. °· Keep your nipples clean and dry. °· Empty the first breast completely before going to the other breast. If your baby is not emptying your breasts completely for some reason, use a breast pump to empty your breasts. °· If you go back to work, pump your breasts while at work to stay in time with your nursing schedule. °· Avoid allowing your breasts to become overly filled with milk (engorged). °SEEK MEDICAL CARE IF: °· You have pus-like discharge from the breast. °· Your symptoms do not improve with the treatment prescribed by your health care provider within 2 days. °SEEK IMMEDIATE MEDICAL CARE IF: °· Your pain and swelling are getting worse. °· You have pain that is not controlled with medicine. °· You have a red line extending from the breast toward your armpit. °· You have a fever or persistent symptoms for more than 2-3 days. °· You have a fever and your symptoms suddenly get worse. °MAKE SURE YOU:  °· Understand these instructions. °· Will watch your condition. °· Will get help right away if you are not doing well or get worse. °  °This information is not intended to replace advice given to you by your health care provider. Make sure you discuss any questions you have with your health care provider. °  °Document Released: 10/01/2004 Document Revised: 06/11/2013 Document Reviewed: 01/10/2013 °[employer]vier Interactive Patient Education ©2016 [employer] °Breastfeeding °Deciding to breastfeed is one of the best choices you can make for you and your baby. A change in hormones during pregnancy causes your breast tissue to grow and increases the number and size of your milk ducts. These hormones also allow proteins, sugars, and fats from your blood supply to make breast milk in your milk-producing glands. Hormones prevent breast milk from being released before your baby is born as well as prompt milk flow after birth. Once  breastfeeding has begun, thoughts of your baby, as well as his or her sucking or crying, can stimulate the release of milk from your milk-producing glands.  °BENEFITS OF BREASTFEEDING °For Your Baby °· Your first milk (colostrum) helps your baby's digestive system function better. °· There are antibodies in your milk that help your baby fight off infections. °· Your baby has a lower incidence of asthma, allergies, and sudden infant death syndrome. °· The nutrients in breast milk are better for your baby than infant formulas and are designed uniquely for your baby's needs. °· Breast milk improves your baby's brain development. °· Your baby is less likely to develop other conditions, such as childhood obesity, asthma, or type 2 diabetes mellitus. °For You °· Breastfeeding helps to create a very special bond between you and your baby. °· Breastfeeding is convenient. Breast milk is always available at the correct temperature and costs nothing. °· Breastfeeding helps to burn calories and helps you lose the weight gained during pregnancy. °· Breastfeeding makes your uterus contract to its prepregnancy size faster and slows bleeding (lochia) after you give birth.   °· Breastfeeding helps to lower your risk of developing type   2 diabetes mellitus, osteoporosis, and breast or ovarian cancer later in life. °SIGNS THAT YOUR BABY IS HUNGRY °Early Signs of Hunger °· Increased alertness or activity. °· Stretching. °· Movement of the head from side to side. °· Movement of the head and opening of the mouth when the corner of the mouth or cheek is stroked (rooting). °· Increased sucking sounds, smacking lips, cooing, sighing, or squeaking. °· Hand-to-mouth movements. °· Increased sucking of fingers or hands. °Late Signs of Hunger °· Fussing. °· Intermittent crying. °Extreme Signs of Hunger °Signs of extreme hunger will require calming and consoling before your baby will be able to breastfeed successfully. Do not wait for the  following signs of extreme hunger to occur before you initiate breastfeeding: °· Restlessness. °· A loud, strong cry. °· Screaming. °BREASTFEEDING BASICS °Breastfeeding Initiation °· Find a comfortable place to sit or lie down, with your neck and back well supported. °· Place a pillow or rolled up blanket under your baby to bring him or her to the level of your breast (if you are seated). Nursing pillows are specially designed to help support your arms and your baby while you breastfeed. °· Make sure that your baby's abdomen is facing your abdomen. °· Gently massage your breast. With your fingertips, massage from your chest wall toward your nipple in a circular motion. This encourages milk flow. You may need to continue this action during the feeding if your milk flows slowly. °· Support your breast with 4 fingers underneath and your thumb above your nipple. Make sure your fingers are well away from your nipple and your baby's mouth. °· Stroke your baby's lips gently with your finger or nipple. °· When your baby's mouth is open wide enough, quickly bring your baby to your breast, placing your entire nipple and as much of the colored area around your nipple (areola) as possible into your baby's mouth. °· More areola should be visible above your baby's upper lip than below the lower lip. °· Your baby's tongue should be between his or her lower gum and your breast. °· Ensure that your baby's mouth is correctly positioned around your nipple (latched). Your baby's lips should create a seal on your breast and be turned out (everted). °· It is common for your baby to suck about 2-3 minutes in order to start the flow of breast milk. °Latching °Teaching your baby how to latch on to your breast properly is very important. An improper latch can cause nipple pain and decreased milk supply for you and poor weight gain in your baby. Also, if your baby is not latched onto your nipple properly, he or she may swallow some air during  feeding. This can make your baby fussy. Burping your baby when you switch breasts during the feeding can help to get rid of the air. However, teaching your baby to latch on properly is still the best way to prevent fussiness from swallowing air while breastfeeding. °Signs that your baby has successfully latched on to your nipple: °· Silent tugging or silent sucking, without causing you pain. °· Swallowing heard between every 3-4 sucks. °· Muscle movement above and in front of his or her ears while sucking. °Signs that your baby has not successfully latched on to nipple: °· Sucking sounds or smacking sounds from your baby while breastfeeding. °· Nipple pain. °If you think your baby has not latched on correctly, slip your finger into the corner of your baby's mouth to break the suction and place it   between your baby's gums. Attempt breastfeeding initiation again. °Signs of Successful Breastfeeding °Signs from your baby: °· A gradual decrease in the number of sucks or complete cessation of sucking. °· Falling asleep. °· Relaxation of his or her body. °· Retention of a small amount of milk in his or her mouth. °· Letting go of your breast by himself or herself. °Signs from you: °· Breasts that have increased in firmness, weight, and size 1-3 hours after feeding. °· Breasts that are softer immediately after breastfeeding. °· Increased milk volume, as well as a change in milk consistency and color by the fifth day of breastfeeding. °· Nipples that are not sore, cracked, or bleeding. °Signs That Your Baby is Getting Enough Milk °· Wetting at least 3 diapers in a 24-hour period. The urine should be clear and pale yellow by age 5 days. °· At least 3 stools in a 24-hour period by age 5 days. The stool should be soft and yellow. °· At least 3 stools in a 24-hour period by age 7 days. The stool should be seedy and yellow. °· No loss of weight greater than 10% of birth weight during the first 3 days of age. °· Average weight  gain of 4-7 ounces (113-198 g) per week after age 4 days. °· Consistent daily weight gain by age 5 days, without weight loss after the age of 2 weeks. °After a feeding, your baby may spit up a small amount. This is common. °BREASTFEEDING FREQUENCY AND DURATION °Frequent feeding will help you make more milk and can prevent sore nipples and breast engorgement. Breastfeed when you feel the need to reduce the fullness of your breasts or when your baby shows signs of hunger. This is called "breastfeeding on demand." Avoid introducing a pacifier to your baby while you are working to establish breastfeeding (the first 4-6 weeks after your baby is born). After this time you may choose to use a pacifier. Research has shown that pacifier use during the first year of a baby's life decreases the risk of sudden infant death syndrome (SIDS). °Allow your baby to feed on each breast as long as he or she wants. Breastfeed until your baby is finished feeding. When your baby unlatches or falls asleep while feeding from the first breast, offer the second breast. Because newborns are often sleepy in the first few weeks of life, you may need to awaken your baby to get him or her to feed. °Breastfeeding times will vary from baby to baby. However, the following rules can serve as a guide to help you ensure that your baby is properly fed: °· Newborns (babies 4 weeks of age or younger) may breastfeed every 1-3 hours. °· Newborns should not go longer than 3 hours during the day or 5 hours during the night without breastfeeding. °· You should breastfeed your baby a minimum of 8 times in a 24-hour period until you begin to introduce solid foods to your baby at around 6 months of age. °BREAST MILK PUMPING °Pumping and storing breast milk allows you to ensure that your baby is exclusively fed your breast milk, even at times when you are unable to breastfeed. This is especially important if you are going back to work while you are still  breastfeeding or when you are not able to be present during feedings. Your lactation consultant can give you guidelines on how long it is safe to store breast milk. °A breast pump is a machine that allows you to pump milk   from your breast into a sterile bottle. The pumped breast milk can then be stored in a refrigerator or freezer. Some breast pumps are operated by hand, while others use electricity. Ask your lactation consultant which type will work best for you. Breast pumps can be purchased, but some hospitals and breastfeeding support groups lease breast pumps on a monthly basis. A lactation consultant can teach you how to hand express breast milk, if you prefer not to use a pump. °CARING FOR YOUR BREASTS WHILE YOU BREASTFEED °Nipples can become dry, cracked, and sore while breastfeeding. The following recommendations can help keep your breasts moisturized and healthy: °· Avoid using soap on your nipples. °· Wear a supportive bra. Although not required, special nursing bras and tank tops are designed to allow access to your breasts for breastfeeding without taking off your entire bra or top. Avoid wearing underwire-style bras or extremely tight bras. °· Air dry your nipples for 3-4 minutes after each feeding. °· Use only cotton bra pads to absorb leaked breast milk. Leaking of breast milk between feedings is normal. °· Use lanolin on your nipples after breastfeeding. Lanolin helps to maintain your skin's normal moisture barrier. If you use pure lanolin, you do not need to wash it off before feeding your baby again. Pure lanolin is not toxic to your baby. You may also hand express a few drops of breast milk and gently massage that milk into your nipples and allow the milk to air dry. °In the first few weeks after giving birth, some women experience extremely full breasts (engorgement). Engorgement can make your breasts feel heavy, warm, and tender to the touch. Engorgement peaks within 3-5 days after you give  birth. The following recommendations can help ease engorgement: °· Completely empty your breasts while breastfeeding or pumping. You may want to start by applying warm, moist heat (in the shower or with warm water-soaked hand towels) just before feeding or pumping. This increases circulation and helps the milk flow. If your baby does not completely empty your breasts while breastfeeding, pump any extra milk after he or she is finished. °· Wear a snug bra (nursing or regular) or tank top for 1-2 days to signal your body to slightly decrease milk production. °· Apply ice packs to your breasts, unless this is too uncomfortable for you. °· Make sure that your baby is latched on and positioned properly while breastfeeding. °If engorgement persists after 48 hours of following these recommendations, contact your health care provider or a lactation consultant. °OVERALL HEALTH CARE RECOMMENDATIONS WHILE BREASTFEEDING °· Eat healthy foods. Alternate between meals and snacks, eating 3 of each per day. Because what you eat affects your breast milk, some of the foods may make your baby more irritable than usual. Avoid eating these foods if you are sure that they are negatively affecting your baby. °· Drink milk, fruit juice, and water to satisfy your thirst (about 10 glasses a day). °· Rest often, relax, and continue to take your prenatal vitamins to prevent fatigue, stress, and anemia. °· Continue breast self-awareness checks. °· Avoid chewing and smoking tobacco. Chemicals from cigarettes that pass into breast milk and exposure to secondhand smoke may harm your baby. °· Avoid alcohol and drug use, including marijuana. °Some medicines that may be harmful to your baby can pass through breast milk. It is important to ask your health care provider before taking any medicine, including all over-the-counter and prescription medicine as well as vitamin and herbal supplements. °It is possible to become   pregnant while breastfeeding. If  birth control is desired, ask your health care provider about options that will be safe for your baby. °SEEK MEDICAL CARE IF: °· You feel like you want to stop breastfeeding or have become frustrated with breastfeeding. °· You have painful breasts or nipples. °· Your nipples are cracked or bleeding. °· Your breasts are red, tender, or warm. °· You have a swollen area on either breast. °· You have a fever or chills. °· You have nausea or vomiting. °· You have drainage other than breast milk from your nipples. °· Your breasts do not become full before feedings by the fifth day after you give birth. °· You feel sad and depressed. °· Your baby is too sleepy to eat well. °· Your baby is having trouble sleeping.   °· Your baby is wetting less than 3 diapers in a 24-hour period. °· Your baby has less than 3 stools in a 24-hour period. °· Your baby's skin or the white part of his or her eyes becomes yellow.   °· Your baby is not gaining weight by 5 days of age. °SEEK IMMEDIATE MEDICAL CARE IF: °· Your baby is overly tired (lethargic) and does not want to wake up and feed. °· Your baby develops an unexplained fever. °  °This information is not intended to replace advice given to you by your health care provider. Make sure you discuss any questions you have with your health care provider. °  °Document Released: 06/06/2005 Document Revised: 02/25/2015 Document Reviewed: 11/28/2012 °Elsevier Interactive Patient Education ©2016 [employer] ° °

## 2016-02-04 NOTE — Progress Notes (Addendum)
Patient ID: Abbott PaoSamantha R Pascual, female   DOB: Nov 08, 1985, 30 y.o.   MRN: [id number] Subjective: S/P Primary Cesarean Delivery d/t Breech Presentation POD# 3 Information for the patient's newborn:  Con Memosshby, Girl Corrina [[id number]][[id number]]  female    Reports feeling well. Feeding: breast Patient reports tolerating PO.  Breast symptoms: none Pain controlled with acetaminophen and ibuprofen (OTC) / "Does not like the Oxycotin. It made me very nauseated and dizzy." Denies HA/SOB/C/P/N/V/dizziness. Flatus present. (+) BM. She reports vaginal bleeding as normal, without clots.  She is ambulating, urinating without difficult.     Objective:   VS:  Vitals:   02/02/16 1800 02/03/16 0523 02/03/16 1737 02/04/16 0556  BP: 100/62 (!) 94/52 107/64 109/71  Pulse: 75 74 89 68  Resp: 18 18 19 18   Temp: 99.2 F (37.3 C) 98.2 F (36.8 C) 99 F (37.2 C) 98.8 F (37.1 C)  TempSrc: Oral  Oral Oral  SpO2:      Weight:      Height:        No intake or output data in the 24 hours ending 02/04/16 1233      Recent Labs  02/02/16 0556  WBC 12.2*  HGB 8.7*  HCT 24.6*  PLT 184     Blood type: --/--/O POS (08/11 1045)  Rubella: Immune (01/25 0000)     Physical Exam:   General: alert, cooperative and no distress  Abdomen: soft, nontender, normal bowel sounds  Incision: clean, dry, intact and skin well-approximated with sutures  Uterine Fundus: firm, 2 FB below umbilicus, nontender  Lochia: minimal  Ext: extremities normal, atraumatic, no cyanosis or edema, Homans sign is negative, no sign of DVT and no edema, redness or tenderness in the calves or thighs   Assessment/Plan: 30 y.o.   POD# 3.  S/P Cesarean Delivery.  Indications: breech                Principal Problem:   Postpartum care following cesarean delivery (8/14) Active Problems:   Breech presentation delivered  Doing well, stable.               Regular diet as tolerated Routine post-op care D/C home today F/U for PP visit with Dr.  Ernestina PennaFogleman in 6 wks  Kenard GowerAWSON, Dmiyah Liscano, M, MSN, CNM 02/04/2016, 12:33 PM

## 2016-02-04 NOTE — Lactation Note (Signed)
This note was copied from a baby's chart. Lactation Consultation Note: Experienced BF mom has baby latched to breast when I went into room. Reports milk is coming in and baby has been latching well. Left nipple a little sore. Baby has been nursing for 30 min and is going off to sleep. Mom reports breast feels softer. Mom asking for #21 flanges- given. Has Medela PIS for home. Asking for curved tip syringe- given with instructions for use. Has used SNS and feeding tube/syringe but wants to give small amounts after nursing so will use curved tip syringe. Baby content and off to sleep. To go home on phototherapy. To see Ped in 2 days. No questions at present. Reviewed our phone number, OP appointments and BFSG as resources after DC To call prn  Patient Name: Lindsey Jeralene HuffSamantha Tucker ZOXWR'UToday's Date: 02/04/2016 Reason for consult: Follow-up assessment;Hyperbilirubinemia   Maternal Data Formula Feeding for Exclusion: No Has patient been taught Hand Expression?: Yes Does the patient have breastfeeding experience prior to this delivery?: Yes  Feeding Feeding Type: Breast Fed  LATCH Score/Interventions Latch: Grasps breast easily, tongue down, lips flanged, rhythmical sucking.  Audible Swallowing: A few with stimulation  Type of Nipple: Everted at rest and after stimulation  Comfort (Breast/Nipple): Filling, red/small blisters or bruises, mild/mod discomfort  Problem noted: Mild/Moderate discomfort Interventions (Mild/moderate discomfort): Hand expression;Hand massage  Hold (Positioning): No assistance needed to correctly position infant at breast.  LATCH Score: 8  Lactation Tools Discussed/Used [facility] No   Consult Status Consult Status: Complete    Pamelia HoitWeeks, Kennita Pavlovich D 02/04/2016, 9:30 AM

## 2016-02-04 NOTE — Discharge Summary (Signed)
OB Discharge Summary     Patient Name: Lindsey Tucker DOB: April 23, 1986 MRN: [id number]  Date of admission: 02/01/2016 Delivering MD: Noland FordyceFOGLEMAN, KELLY   Date of discharge: 02/04/2016  Admitting diagnosis: Breech Presentation Intrauterine pregnancy: 8158w0d     Secondary diagnosis:  Principal Problem:   Postpartum care following cesarean delivery (8/14) Active Problems:   Breech presentation delivered  Additional problems: none     Discharge diagnosis: Term Pregnancy Delivered                                                                                                Post partum procedures:none  Augmentation: N/A  Complications: None  Hospital course:  Sceduled C/S   30 y.o. yo G2P2002 at 1658w0d was admitted to the hospital 02/01/2016 for scheduled cesarean section with the following indication:Malpresentation - Breech.  Membrane Rupture Time/Date: 10:41 AM ,02/01/2016   Patient delivered a Viable infant.02/01/2016  Details of operation can be found in separate operative note.  Pateint had an uncomplicated postpartum course.  She is ambulating, tolerating a regular diet, passing flatus, and urinating well. Patient is discharged home in stable condition on  02/07/16          Physical exam Vitals:   02/02/16 1800 02/03/16 0523 02/03/16 1737 02/04/16 0556  BP: 100/62 (!) 94/52 107/64 109/71  Pulse: 75 74 89 68  Resp: 18 18 19 18   Temp: 99.2 F (37.3 C) 98.2 F (36.8 C) 99 F (37.2 C) 98.8 F (37.1 C)  TempSrc: Oral  Oral Oral  SpO2:      Weight:      Height:       General: alert, cooperative and no distress Lochia: appropriate Uterine Fundus: firm, midline, U-2 Incision: Healing well with no significant drainage, No significant erythema, Dressing is clean, dry, and intact DVT Evaluation: No evidence of DVT seen on physical exam. Negative Homan's sign. No cords or calf tenderness. No significant calf/ankle edema. Labs: Lab Results  Component Value Date   WBC 12.2  (H) 02/02/2016   HGB 8.7 (L) 02/02/2016   HCT 24.6 (L) 02/02/2016   MCV 86.9 02/02/2016   PLT 184 02/02/2016   No flowsheet data found.  Discharge instruction: per After Visit Summary and "Baby and Me Booklet".  After visit meds:    Medication List    TAKE these medications   albuterol 108 (90 Base) MCG/ACT inhaler Commonly known as:  PROVENTIL HFA;VENTOLIN HFA Inhale into the lungs every 6 (six) hours as needed for wheezing or shortness of breath.   FERRALET 90 PO Take 1 tablet by mouth daily.   ibuprofen 600 MG tablet Commonly known as:  ADVIL,MOTRIN Take 1 tablet (600 mg total) by mouth every 6 (six) hours.   loratadine 10 MG tablet Commonly known as:  CLARITIN Take 10 mg by mouth daily.   prenatal multivitamin Tabs tablet Take 1 tablet by mouth daily at 12 noon.   ranitidine 150 MG tablet Commonly known as:  ZANTAC Take 150 mg by mouth 2 (two) times daily.       Diet: routine diet  Activity:  Advance as tolerated. Pelvic rest for 6 weeks.   Outpatient follow up:6 weeks Follow up Appt:No future appointments. Follow up Visit:No Follow-up on file.  Postpartum contraception: Undecided  Newborn Data: Live born female on 02/01/2016 Birth Weight: 6 lb 15.6 oz (3165 g) APGAR: 9,   Baby Feeding: Breast Disposition:home with mother   02/04/2016 Raelyn MoraAWSON, Beniah Magnan, Judie PetitM, CNM

## 2016-02-10 ENCOUNTER — Ambulatory Visit (HOSPITAL_COMMUNITY)
Admission: RE | Admit: 2016-02-10 | Discharge: 2016-02-10 | Disposition: A | Discharge status: Home / Self Care | Payer: Managed Care, Other (non HMO) | Source: Ambulatory Visit | Attending: Internal Medicine | Admitting: Internal Medicine

## 2016-02-10 NOTE — Lactation Note (Signed)
Lactation Consult - please see LC Impression under oral assessment , LC impression and LC plan below.   Mother's reason for visit: pain on the left side  Visit Type:  Feeding assessment  Appointment Notes:   Mom called and requested an Lc O/P appt today due to left sided nipple soreness with latch  Consult:  Initial Lactation Consultant:  Kathrin Greathouse  ________________________________________________________________________ Lindsey Tucker Name:  June Jeanmarie Plant Date of Birth:  [birth year]/01/2016 Pediatrician: Dr. Erma Heritage  Gender:  female Gestational Age: [redacted]w[redacted]d (At Birth) Birth Weight:  6 lb 15.6 oz (3165 g) Weight at Discharge:  Weight: 6 lb 9.8 oz (3000 g)                                   Date of Discharge:  02/04/2016      Filed Weights   02/02/16 0053 02/02/16 2352 02/03/16 2336  Weight: 6 lb 12.1 oz (3065 g) 6 lb 9.5 oz (2990 g) 6 lb 9.8 oz (3000 g)  [facility]: 6-11 oz 8/21   Location:Pediatrician's office Weight today: 6-13.5 oz , 3104 g  ________________________________________________________________  Mother's Name: [name] Type of delivery:  C/section  Breastfeeding Experience:  2nd baby , 1st experience was with latch challenges and didn't feed the breast long , pumped and bottle fed.  thsi baby  Enjoyable when it doesn't hurt , desire to breast feed for a year  Maternal Medical Conditions:  Per mom blood loss 1200 ml - anemic before and after delivery  Maternal Medications:  PNV , Iron,   ________________________________________________________________________  Breastfeeding History (Post Discharge)  Frequency of breastfeeding:  Every 2-3 hours and on cue  Duration of feeding:  30 -45 mins , sometimes and hour.   Supplementing : none   Pumping: occasionally with hand pump or DEBP Medela with #21 flange from hospital -  Per mom feels like I need to increase the flange due to tightness and left sore nipple  and cut on the side of the nipple   Infant Intake and Output Assessment  Voids:  8 in 24 hrs.  Color:  Clear yellow Stools:  4-6 in 24 hrs.  Color:  Brown and Yellow  ________________________________________________________________________  Maternal Breast Assessment  Breast:  Full Nipple:  Erect right clear , left small pin like pimple on the outer aspect of the left nipple.  ( appears to be a nipple plug ,appears larger when the baby releases From feeding) also bruising noted.  Pain level:  5 Pain interventions:  Expressed breast milk  _______________________________________________________________________ Feeding Assessment/Evaluation - baby presents with both mom and dad at consult alert and awake  And calm. Feeding cues noted and resolving jaundice. ( please note LC's oral assessment note below )   Initial feeding assessment:  Infant's oral assessment:  LC with gloved fingers checked the baby's mouth,  And noted a short labial frenulum just above the gum line , upper lip stretches well  With exam and @ the latch both breast. High palate, when sucking on a gloved finger noted  Intermittent humping of the back of her tongue. Baby able to elevate her tongue above the  Corners of her mouth and has good laterization. LC concerns is the discomfort mom intermittently feels  On the left breast with latch is the depth isn't achieved. Baby does open wide with latch and seems to  Do the best ( meaning minimal pain for mom ) with breast compressions and assist. Light bruising noted  On the nipple and  Areola of areola closest to the base of the nipple.  Pain for mom is concerning.   Positioning:  Cross cradle Right breast  LATCH documentation:  Latch:  2 = Grasps breast easily, tongue down, lips flanged, rhythmical sucking.  Audible swallowing:  2 = Spontaneous and intermittent  Type of nipple:  2 = Everted at rest and after stimulation  Comfort (Breast/Nipple):  1 = Filling,  red/small blisters or bruises, mild/mod discomfort  Hold (Positioning):  2 = No assistance needed to correctly position infant at breast  LATCH score: 9   Attached assessment:  Deep  Lips flanged:  Yes.    Lips untucked:  Yes.    Suck assessment:  Nutritive  Tools:  None for this latch   Pre-feed weight: 3104 g , 6-13.5 oz  Post-feed weight: 3124 g , 6-14 oz  Amount transferred: 20 ml  Amount supplemented: none   Additional Feeding Assessment -   Infant's oral assessment:  Variance - see above note   Positioning:  Football Left breast  LATCH documentation:  Latch:  2 = Grasps breast easily, tongue down, lips flanged, rhythmical sucking.  Audible swallowing:  2 = Spontaneous and intermittent  Type of nipple:  2 = Everted at rest and after stimulation  Comfort (Breast/Nipple):  1 = Filling, red/small blisters or bruises, mild/mod discomfort  Hold (Positioning):  1 = Assistance needed to correctly position infant at breast and maintain latch  LATCH score: 8   Attached assessment:  Deep  Lips flanged:  Yes.    Lips untucked:  No.  Suck assessment:  Nutritive  Tools:  None  Instructed on use and cleaning of tool:  None   Pre-feed weight: 3124 g , 6-14.2 oz  Post-feed weight: 3152 g , 6-15.2 oz  Amount transferred:  28 ml  Amount supplemented:  None   Additional Feeding:  Pre-weight: 3152 g , 6-15.2 oz  Post - weight: 3168 g , 6-15.8 oz  Amount transferred: 16 ml    Total amount pumped post feed: mom did not post pump , no need   Total amount transferred:  64 ml ( excellent for 30 days old )  Total supplement given:  None needed ,  Lactation Impression:  Baby is 30 days old and almost back up to birth weight  Milk transfer from the breast was excellent - 64 ml  Jaundice appears to be resolving and per mom the baby has been more alert  As the day goes on, still somewhat sluggish in the am .  LC suspects and posterior lingual short frenulum - see oral assessment  above.  Parents - mentioned this consult was a 2nd opinion LC consult - LC in the community  Also had the same findings and  Suggested and oral specialist .  This LC  Agreed with the findings and recommended if the [facility] plan below doesn't seem to  Decrease moms plan - recommending oral assessment by an oral specialist - resource sheet  Given to mom at consult. ( mom and dad mentioned their 762 1/39 year old is being watched for the  Short labial frenulum)   Lactation Plan of care: Praised mom for her breast feeding efforts and dad for his support  Sore nipple tx - EBM to nipples liberally, Nipple cream sparingly  Nipple plug and cracking - prior to latch  breast massage , hand express, pre- pump 8-10 strokes  To prime the milk ducts and loosen up the plug . Breast compressions with latch until swallows and  Comfort achieved.  Don't allow June  to nibble her way onto the breast - needs to have wide open mouth before latch  Continue to alternate between at least 2 different positions If June only feeds feeds both breast and breast are still feeling full release down 10 mins . Example after am  Feeding when your breast could be the fulliest.  Per mom F/U weight check with smart start next week and also Pedis appt.

## 2016-04-21 ENCOUNTER — Ambulatory Visit (HOSPITAL_COMMUNITY): Payer: Managed Care, Other (non HMO)

## 2018-02-28 ENCOUNTER — Inpatient Hospital Stay (HOSPITAL_COMMUNITY)
Admission: EM | Admit: 2018-02-28 | Discharge: 2018-03-10 | DRG: 357 | Disposition: A | Discharge status: Home / Self Care | Payer: 59 | Attending: General Surgery | Admitting: General Surgery

## 2018-02-28 ENCOUNTER — Encounter (HOSPITAL_COMMUNITY): Payer: Self-pay

## 2018-02-28 ENCOUNTER — Other Ambulatory Visit: Payer: Self-pay

## 2018-02-28 ENCOUNTER — Emergency Department (HOSPITAL_COMMUNITY): Payer: 59

## 2018-02-28 ENCOUNTER — Ambulatory Visit: Payer: Self-pay | Admitting: Nurse Practitioner

## 2018-02-28 VITALS — BP 88/60 | HR 103 | Temp 98.5°F | Resp 16 | Wt 87.4 lb

## 2018-02-28 DIAGNOSIS — N736 Female pelvic peritoneal adhesions (postinfective): Secondary | ICD-10-CM | POA: Diagnosis present

## 2018-02-28 DIAGNOSIS — Z803 Family history of malignant neoplasm of breast: Secondary | ICD-10-CM

## 2018-02-28 DIAGNOSIS — Z791 Long term (current) use of non-steroidal anti-inflammatories (NSAID): Secondary | ICD-10-CM

## 2018-02-28 DIAGNOSIS — Z79899 Other long term (current) drug therapy: Secondary | ICD-10-CM

## 2018-02-28 DIAGNOSIS — J45909 Unspecified asthma, uncomplicated: Secondary | ICD-10-CM | POA: Diagnosis present

## 2018-02-28 DIAGNOSIS — M419 Scoliosis, unspecified: Secondary | ICD-10-CM | POA: Diagnosis present

## 2018-02-28 DIAGNOSIS — Z885 Allergy status to narcotic agent status: Secondary | ICD-10-CM

## 2018-02-28 DIAGNOSIS — R000 Tachycardia, unspecified: Secondary | ICD-10-CM | POA: Diagnosis present

## 2018-02-28 DIAGNOSIS — K3533 Acute appendicitis with perforation and localized peritonitis, with abscess: Principal | ICD-10-CM | POA: Diagnosis present

## 2018-02-28 DIAGNOSIS — K219 Gastro-esophageal reflux disease without esophagitis: Secondary | ICD-10-CM | POA: Diagnosis present

## 2018-02-28 DIAGNOSIS — E876 Hypokalemia: Secondary | ICD-10-CM | POA: Diagnosis not present

## 2018-02-28 DIAGNOSIS — Z681 Body mass index (BMI) 19 or less, adult: Secondary | ICD-10-CM

## 2018-02-28 DIAGNOSIS — R636 Underweight: Secondary | ICD-10-CM | POA: Diagnosis present

## 2018-02-28 DIAGNOSIS — Z8249 Family history of ischemic heart disease and other diseases of the circulatory system: Secondary | ICD-10-CM

## 2018-02-28 DIAGNOSIS — Z8042 Family history of malignant neoplasm of prostate: Secondary | ICD-10-CM

## 2018-02-28 DIAGNOSIS — R109 Unspecified abdominal pain: Secondary | ICD-10-CM

## 2018-02-28 DIAGNOSIS — Z833 Family history of diabetes mellitus: Secondary | ICD-10-CM

## 2018-02-28 LAB — URINALYSIS, ROUTINE W REFLEX MICROSCOPIC
Bilirubin Urine: NEGATIVE
Glucose, UA: NEGATIVE mg/dL
Hgb urine dipstick: NEGATIVE
KETONES UR: 80 mg/dL — AB
Leukocytes, UA: NEGATIVE
Nitrite: NEGATIVE
PH: 5 (ref 5.0–8.0)
PROTEIN: 100 mg/dL — AB
Specific Gravity, Urine: 1.034 — ABNORMAL HIGH (ref 1.005–1.030)

## 2018-02-28 LAB — I-STAT BETA HCG BLOOD, ED (MC, WL, AP ONLY): I-stat hCG, quantitative: 18.3 m[IU]/mL — ABNORMAL HIGH (ref <5)

## 2018-02-28 LAB — COMPREHENSIVE METABOLIC PANEL
ALBUMIN: 3.6 g/dL (ref 3.5–5.0)
ALK PHOS: 88 U/L (ref 38–126)
ALT: 13 U/L (ref 0–44)
AST: 18 U/L (ref 15–41)
Anion gap: 11 (ref 5–15)
BUN: 9 mg/dL (ref 6–20)
CALCIUM: 8.6 mg/dL — AB (ref 8.9–10.3)
CHLORIDE: 105 mmol/L (ref 98–111)
CO2: 20 mmol/L — AB (ref 22–32)
CREATININE: 0.81 mg/dL (ref 0.44–1.00)
GFR calc non Af Amer: >60 mL/min (ref >60)
GLUCOSE: 118 mg/dL — AB (ref 70–99)
Potassium: 3.5 mmol/L (ref 3.5–5.1)
SODIUM: 136 mmol/L (ref 135–145)
Total Bilirubin: 0.7 mg/dL (ref 0.3–1.2)
Total Protein: 7 g/dL (ref 6.5–8.1)

## 2018-02-28 LAB — CBC
HCT: 37.4 % (ref 36.0–46.0)
Hemoglobin: 12.5 g/dL (ref 12.0–15.0)
MCH: 30.7 pg (ref 26.0–34.0)
MCHC: 33.4 g/dL (ref 30.0–36.0)
MCV: 91.9 fL (ref 78.0–100.0)
PLATELETS: 289 10*3/uL (ref 150–400)
RBC: 4.07 MIL/uL (ref 3.87–5.11)
RDW: 12.7 % (ref 11.5–15.5)
WBC: 16.9 10*3/uL — ABNORMAL HIGH (ref 4.0–10.5)

## 2018-02-28 LAB — POC URINE PREG, ED: PREG TEST UR: NEGATIVE

## 2018-02-28 LAB — LIPASE, BLOOD: LIPASE: 26 U/L (ref 11–51)

## 2018-02-28 LAB — HCG, QUANTITATIVE, PREGNANCY

## 2018-02-28 MED ORDER — IOHEXOL 300 MG/ML  SOLN
100.0000 mL | Freq: Once | INTRAMUSCULAR | Status: AC | PRN
  Administered 2018-02-28: 100 mL via INTRAVENOUS

## 2018-02-28 MED ORDER — FENTANYL CITRATE (PF) 100 MCG/2ML IJ SOLN
25.0000 ug | Freq: Once | INTRAMUSCULAR | Status: AC
Start: 2018-02-28 — End: 2018-02-28
  Administered 2018-02-28: 25 ug via INTRAVENOUS
  Filled 2018-02-28: qty 2

## 2018-02-28 MED ORDER — ONDANSETRON 4 MG PO TBDP
4.0000 mg | ORAL_TABLET | Freq: Once | ORAL | Status: AC | PRN
  Administered 2018-02-28: 4 mg via ORAL
  Filled 2018-02-28: qty 1

## 2018-02-28 MED ORDER — FENTANYL CITRATE (PF) 100 MCG/2ML IJ SOLN
50.0000 ug | Freq: Once | INTRAMUSCULAR | Status: AC | PRN
  Administered 2018-02-28: 50 ug via INTRAVENOUS
  Filled 2018-02-28: qty 2

## 2018-02-28 MED ORDER — PIPERACILLIN-TAZOBACTAM 3.375 G IVPB 30 MIN
3.3750 g | Freq: Once | INTRAVENOUS | Status: AC
  Administered 2018-02-28: 3.375 g via INTRAVENOUS
  Filled 2018-02-28 (×2): qty 50

## 2018-02-28 MED ORDER — SODIUM CHLORIDE 0.9 % IV BOLUS
1000.0000 mL | Freq: Once | INTRAVENOUS | Status: AC
  Administered 2018-02-28: 1000 mL via INTRAVENOUS

## 2018-02-28 MED ORDER — ACETAMINOPHEN 325 MG PO TABS
650.0000 mg | ORAL_TABLET | Freq: Once | ORAL | Status: AC
  Administered 2018-02-28: 650 mg via ORAL
  Filled 2018-02-28: qty 2

## 2018-02-28 NOTE — ED Triage Notes (Signed)
Pt. Developed n/v/d late Saturday evening and has become worse in the last few days.  She is having abdominal cramping and loose stool .  She is also feeling weak and hands ar tingling.  She also feels syncope.   Pt. Also reports having a fever.  Pt. Is alert and oriented /x4.  Skin is pale, warm and dry.   Lt. Side abdominal pain.

## 2018-02-28 NOTE — ED Notes (Signed)
Pt requesting pain medication, PA made aware; Provider at bedside

## 2018-02-28 NOTE — ED Notes (Signed)
ED Provider at bedside. 

## 2018-02-28 NOTE — ED Provider Notes (Signed)
Patient placed in Quick Look pathway, seen and evaluated   Chief Complaint: n/v/d, abdominal pain  HPI:   Lindsey Tucker is a 32 y.o. female who presents t the ED with n/v/d and abdominal pain that started 4 days ago and have continued. Patient went to Urgent Care today and they told her to come to the ED for evaluation.   ROS: GI: n/v/d abdominal pain  General: fever  Physical Exam:  BP 115/80 (BP Location: Right Arm)   Pulse (!) 127   Temp 99.6 F (37.6 C) (Oral)   Resp 17   SpO2 100%    Gen: No distress  Neuro: Awake and Alert  Skin: Warm and dry  Abdomen: soft, tender with palpation left upper and left lower abdomen. No rebound or guarding.       Initiation of care has begun. The patient has been counseled on the process, plan, and necessity for staying for the completion/evaluation, and the remainder of the medical screening examination    Janne Napoleon, NP 02/28/18 1418    Vanetta Mulders, MD 03/03/18 [phone]

## 2018-02-28 NOTE — ED Notes (Signed)
Main lab to add on hcg quantitative

## 2018-02-28 NOTE — ED Notes (Signed)
White MD CCS was at bedside to assess pt.  Pt ambulated to the BR with steady gait.

## 2018-02-28 NOTE — Patient Instructions (Signed)
Abdominal Pain, Adult -GO TO THE EMERGENCY ROOM.  YOU ARE BEING SENT DUE TO DURATION OF SYMPTOMS, LOW BLOOD PRESSURE, TACHYCARDIA AND ABDOMINAL TENDERNESS.  Abdominal pain can be caused by many things. Often, abdominal pain is not serious and it gets better with no treatment or by being treated at home. However, sometimes abdominal pain is serious. Your health care provider will do a medical history and a physical exam to try to determine the cause of your abdominal pain. Follow these instructions at home:  Take over-the-counter and prescription medicines only as told by your health care provider. Do not take a laxative unless told by your health care provider.  Drink enough fluid to keep your urine clear or pale yellow.  Watch your condition for any changes.  Keep all follow-up visits as told by your health care provider. This is important. Contact a health care provider if:  Your abdominal pain changes or gets worse.  You are not hungry or you lose weight without trying.  You are constipated or have diarrhea for more than 2-3 days.  You have pain when you urinate or have a bowel movement.  Your abdominal pain wakes you up at night.  Your pain gets worse with meals, after eating, or with certain foods.  You are throwing up and cannot keep anything down.  You have a fever. Get help right away if:  Your pain does not go away as soon as your health care provider told you to expect.  You cannot stop throwing up.  Your pain is only in areas of the abdomen, such as the right side or the left lower portion of the abdomen.  You have bloody or black stools, or stools that look like tar.  You have severe pain, cramping, or bloating in your abdomen.  You have signs of dehydration, such as: ? Dark urine, very little urine, or no urine. ? Cracked lips. ? Dry mouth. ? Sunken eyes. ? Sleepiness. ? Weakness. This information is not intended to replace advice given to you by your  health care provider. Make sure you discuss any questions you have with your health care provider. Document Released: 03/16/2005 Document Revised: 12/25/2015 Document Reviewed: 11/18/2015 Elsevier Interactive Patient Education  [employer].

## 2018-02-28 NOTE — ED Notes (Signed)
Patient transported to CT 

## 2018-02-28 NOTE — Progress Notes (Addendum)
Subjective:     Lindsey Tucker is a 32 y.o. female who presents for evaluation of abdominal pain. Onset was 4 days ago. Symptoms have been gradually worsening. The pain is described as cramping, and is 7/10 in intensity. Pain is located in the LUQ and LLQ without radiation.  Aggravating factors: none.  Alleviating factors: none. Associated symptoms: diarrhea, vomiting and headache and fever.  The patient denies constipation, dysuria and frequency.  The patient states she ate at a food truck on this past weekend, Saturday, around 6 PM and noticed her symptoms starting around 3 hours after that.  The patient states she has been trying to take ibuprofen and Tylenol to help with her headache with minimal relief.  Patient states she also noticed "red" stool this morning but was not sure if it was due to the red Gatorade that she has been drinking.  This occurred on one episode.  The patient's history has been marked as reviewed and updated as appropriate. Past Medical History:  Diagnosis Date  . Asthma   . GERD (gastroesophageal reflux disease)   . Postpartum care following cesarean delivery (8/14) 02/02/2016  . Scoliosis   . Vacuum extractor delivery, delivered (12/12) 06/01/2013   Current Outpatient Medications  Medication Sig Dispense Refill  . albuterol (PROVENTIL HFA;VENTOLIN HFA) 108 (90 Base) MCG/ACT inhaler Inhale into the lungs every 6 (six) hours as needed for wheezing or shortness of breath.    . Fe Cbn-Fe Gluc-FA-B12-C-DSS (FERRALET 90 PO) Take 1 tablet by mouth daily.    Marland Kitchen ibuprofen (ADVIL,MOTRIN) 800 MG tablet Take 1 tablet (800 mg total) by mouth every 8 (eight) hours as needed for mild pain, moderate pain or cramping. (Patient not taking: Reported on 02/28/2018) 30 tablet 1  . loratadine (CLARITIN) 10 MG tablet Take 10 mg by mouth daily.    . Prenatal Vit-Fe Fumarate-FA (PRENATAL MULTIVITAMIN) TABS tablet Take 1 tablet by mouth daily at 12 noon.    . ranitidine (ZANTAC) 150 MG tablet  Take 150 mg by mouth 2 (two) times daily.     No current facility-administered medications for this visit.    Allergies: Morphine and related  Review of Systems Constitutional: positive for anorexia, chills, fatigue, fevers and malaise, negative for night sweats and sweats Eyes: negative Ears, nose, mouth, throat, and face: negative Respiratory: negative Cardiovascular: negative Gastrointestinal: positive for abdominal pain, diarrhea, nausea and vomiting, negative for dyspepsia, jaundice and reflux symptoms Neurological: positive for dizziness and headaches, negative for paresthesia, seizures, speech problems and vertigo     Objective:    BP (!) 88/60 (BP Location: Right Arm, Patient Position: Sitting, Cuff Size: Normal)   Pulse (!) 103   Temp 98.5 F (36.9 C) (Oral)   Resp 16   Wt 87 lb 6.4 oz (39.6 kg)   SpO2 99%   BMI 17.07 kg/m   Physical Exam  Constitutional: She appears well-developed and well-nourished.  Appears pale with generalized weakness, flushed, ill-appearing  HENT:  Head: Normocephalic and atraumatic.  Mouth/Throat: Uvula is midline. Mucous membranes are pale and dry.  Eyes: Pupils are equal, round, and reactive to light. EOM are normal.  Neck: Normal range of motion. Neck supple. No tracheal deviation present. No thyromegaly present.  Cardiovascular: Regular rhythm and normal heart sounds.  tachycardic  Pulmonary/Chest: Effort normal and breath sounds normal.  Abdominal: Soft. Bowel sounds are normal. There is tenderness (LUQ/LLQ). There is guarding.  Skin: Skin is warm and dry. There is pallor.  Psychiatric: She has a  normal mood and affect.      Assessment:   Abdominal pain   Plan:   Exam findings, diagnosis etiology and medication use and indications reviewed with patient. Follow- Up and discharge instructions provided.    Patient was instructed to GO TO THE EMERGENCY ROOM.  YOU ARE BEING SENT DUE TO DURATION OF SYMPTOMS, LOW BLOOD PRESSURE,  TACHYCARDIA AND ABDOMINAL TENDERNESS. Patient verbalized understanding of information provided and agrees with plan of care (POC), all questions answered.

## 2018-02-28 NOTE — ED Provider Notes (Signed)
[facility] EMERGENCY DEPARTMENT Provider Note   CSN: [id number] Arrival date & time: 02/28/18  1329     History   Chief Complaint Chief Complaint  Patient presents with  . Abdominal Pain  . Nausea    HPI Lindsey Tucker is a 32 y.o. female.  The history is provided by the patient and medical records. No language interpreter was used.  Abdominal Pain   Associated symptoms include fever, diarrhea, nausea and vomiting.   Lindsey Tucker is a 32 y.o. female  with a PMH of asthma, GERD who presents to the Emergency Department complaining of bilateral lower abdominal pain x 3 days associated with nausea, vomiting.  She was seen at the urgent care who referred her to the ER for further evaluation given her abdominal pain, fever and tachycardia.  Patient states that she started feeling bad 3 days ago with vague, diffuse lower abdominal pain.  The following day, she awoke and was nauseous and had a few episodes of emesis.  She thought she had a stomach bug.  She continued to throw up several times throughout the day.  She endorses low-grade temperature at home and has been taking Tylenol for fever control as well as for pain.  This morning, she still was experiencing abdominal pain.  She assumed that if this was the stomach bug, her symptoms should be improving, however she was still in a great deal of pain and nauseous, therefore went to the urgent care.  Had a few sips of water for taking Tylenol, but otherwise n.p.o. since this morning.  Denies recent travel or sick contacts.  Past Medical History:  Diagnosis Date  . Asthma   . GERD (gastroesophageal reflux disease)   . Postpartum care following cesarean delivery (8/14) 02/02/2016  . Scoliosis   . Vacuum extractor delivery, delivered (12/12) 06/01/2013    Patient Active Problem List   Diagnosis Date Noted  . Postpartum care following cesarean delivery (8/14) 02/02/2016  . Breech presentation delivered 02/01/2016    . Asthma 02/24/2011    Past Surgical History:  Procedure Laterality Date  . CESAREAN SECTION N/A 02/01/2016   Procedure: Primary CESAREAN SECTION;  Surgeon: Noland Fordyce, MD;  Location: [facility] SU[facility];  Service: Obstetrics;  Laterality: N/A;  EDD: 02/08/16 *Time ok per Izora Ribas*  . SPINAL GROWTH RODS  2000, 2003  . WISDOM TOOTH EXTRACTION       OB History    Gravida  2   Para  2   Term  2   Preterm      AB      Living  2     SAB      TAB      Ectopic      Multiple  0   Live Births  2            Home Medications    Prior to Admission medications   Medication Sig Start Date End Date Taking? Authorizing Provider  loratadine (CLARITIN) 10 MG tablet Take 10 mg by mouth daily as needed for allergies.    Yes [provider]  naproxen sodium (ALEVE) 220 MG tablet Take 220 mg by mouth 2 (two) times daily as needed (pain).   Yes [provider]  ibuprofen (ADVIL,MOTRIN) 800 MG tablet Take 1 tablet (800 mg total) by mouth every 8 (eight) hours as needed for mild pain, moderate pain or cramping. Patient not taking: Reported on 02/28/2018 02/04/16   [name], CNM  Family History Family History  Problem Relation Age of Onset  . Hypertension Mother   . Hypertension Father   . Diabetes Maternal Grandfather   . Cancer Maternal Grandfather        prostate  . Cancer Paternal Grandmother        breast    Social History Social History   Tobacco Use  . Smoking status: Never Smoker  . Smokeless tobacco: Never Used  Substance Use Topics  . Alcohol use: Yes    Alcohol/week: 2.0 standard drinks    Types: 2 Shots of liquor per week  . Drug use: No     Allergies   Morphine and related   Review of Systems Review of Systems  Constitutional: Positive for fever.  Gastrointestinal: Positive for abdominal pain, diarrhea, nausea and vomiting.  All other systems reviewed and are negative.    Physical Exam Updated Vital Signs BP  110/70   Pulse (!) 112   Temp 99.5 F (37.5 C) (Oral)   Resp 17   Ht 5' (1.524 m)   Wt 39.9 kg   LMP 01/28/2018   SpO2 100%   BMI 17.19 kg/m   Physical Exam  Constitutional: She is oriented to person, place, and time. She appears well-developed and well-nourished. No distress.  HENT:  Head: Normocephalic and atraumatic.  Neck: Neck supple.  Cardiovascular: Regular rhythm and normal heart sounds.  No murmur heard. Tachycardic  Pulmonary/Chest: Effort normal and breath sounds normal. No respiratory distress.  Abdominal: Soft. She exhibits no distension.  Tenderness to palpation to RLQ and LLQ. Hyperactive bowel sounds.   Neurological: She is alert and oriented to person, place, and time.  Skin: Skin is warm and dry.  Nursing note and vitals reviewed.    ED Treatments / Results  Labs (all labs ordered are listed, but only abnormal results are displayed) Labs Reviewed  COMPREHENSIVE METABOLIC PANEL - Abnormal; Notable for the following components:      Result Value   CO2 20 (*)    Glucose, Bld 118 (*)    Calcium 8.6 (*)    All other components within normal limits  CBC - Abnormal; Notable for the following components:   WBC 16.9 (*)    All other components within normal limits  URINALYSIS, ROUTINE W REFLEX MICROSCOPIC - Abnormal; Notable for the following components:   APPearance HAZY (*)    Specific Gravity, Urine 1.034 (*)    Ketones, ur 80 (*)    Protein, ur 100 (*)    Bacteria, UA RARE (*)    All other components within normal limits  I-STAT BETA HCG BLOOD, ED ([facility], [facility], [facility] ONLY) - Abnormal; Notable for the following components:   I-stat hCG, quantitative 18.3 (*)    All other components within normal limits  LIPASE, BLOOD  HCG, QUANTITATIVE, PREGNANCY  POC URINE PREG, ED    EKG None  Radiology Ct Abdomen Pelvis W Contrast  Result Date: 02/28/2018 CLINICAL DATA:  Lower abdominal pain, nausea, vomiting, diarrhea, fever for 4 days EXAM: CT ABDOMEN AND PELVIS  WITH CONTRAST TECHNIQUE: Multidetector CT imaging of the abdomen and pelvis was performed using the standard protocol following bolus administration of intravenous contrast. CONTRAST:  OMNIPAQUE IOHEXOL 300 MG/ML  SOLN COMPARISON:  None. FINDINGS: Lower chest: No acute abnormality. Hepatobiliary: Small subcentimeter left lobe hypodensities are nonspecific. Mild pericholecystic fluid. No gallstones. No obvious gallbladder wall thickening. Pancreas: Unremarkable Spleen: Unremarkable Adrenals/Urinary Tract: Kidneys and adrenal glands are unremarkable. Stomach/Bowel: There is no  enteric contrast within bowel loops. Multiple fluid-filled small and large bowel loops are present in the abdomen and pelvis. This limits evaluation for abscess and perforation. There is a tubular structure containing gas and 2 calcific densities in the upper pelvis on image 51 through 60. It measures up to 13 mm in caliber and is worrisome for a distended appendix. There is a fluid and gas filled structure to the right of this tubular structure measuring 2.7 x 4.6 cm containing bubbles of gas. It appears blind ending and is worrisome for an abscess. It is difficult to differentiate this from a bowel loop without oral contrast. Vascular/Lymphatic: No evidence of aortic aneurysm. No abnormal retroperitoneal adenopathy. The right ovarian vein is distended. 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 Reproductive: Uterus is within normal limits. Other: There is free fluid layering in the pelvis surrounding the distal sigmoid and rectum. Musculoskeletal: Thoracolumbar fusion hardware is in place. Severe scoliosis is present. IMPRESSION: The above findings are worrisome for acute appendicitis and adjacent abscess formation. The suspected distended appendix containing phleboliths measures up to 13 mm in caliber. The suspected abscess measures up to 4.6 cm in diameter. There is no oral contrast therefore it is difficult to differentiate the dilated  appendix and adjacent suspected abscess with normal fluid-filled bowel loops. Other words, these abnormalities may simply represent fluid-filled bowel loops. The above findings are therefore indeterminate. Confirmation with a repeat study and oral contrast may be helpful. Critical Value/emergent results were called by telephone at the time of interpretation on 02/28/2018 at 9:20 pm to Dr. Elizabeth Sauer , who verbally acknowledged these results. Free-fluid in the pelvis. 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 Scoliosis and postoperative changes in the lumbar spine. Indeterminate subcentimeter liver hypodensities. If there is risk or history of malignancy, consider follow-up MRI with contrast. Electronically Signed   By: Jolaine Click M.D.   On: 02/28/2018 21:26    Procedures Procedures (including critical care time)  Medications Ordered in ED Medications  ondansetron (ZOFRAN-ODT) disintegrating tablet 4 mg (4 mg Oral Given 02/28/18 1430)  acetaminophen (TYLENOL) tablet 650 mg (650 mg Oral Given 02/28/18 1539)  sodium chloride 0.9 % bolus 1,000 mL (0 mLs Intravenous Stopped 02/28/18 2018)  iohexol (OMNIPAQUE) 300 MG/ML solution 100 mL (100 mLs Intravenous Contrast Given 02/28/18 2030)  fentaNYL (SUBLIMAZE) injection 25 mcg (25 mcg Intravenous Given 02/28/18 2144)  piperacillin-tazobactam (ZOSYN) IVPB 3.375 g (0 g Intravenous Stopped 02/28/18 2347)  fentaNYL (SUBLIMAZE) injection 50 mcg (50 mcg Intravenous Given 02/28/18 2345)     Initial Impression / Assessment and Plan / ED Course  I have reviewed the triage vital signs and the nursing notes.  Pertinent labs & imaging results that were available during my care of the patient were reviewed by me and considered in my medical decision making (see chart for details).    Lindsey Tucker is a 32 y.o. female who presents to ED for lower abdominal pain.  In triage, patient with temperature of 103 and tachycardic.  On exam, patient still  tachycardic with focal area of tenderness to right lower quadrant.  She is also mildly tender to the left lower quadrant as well.  Labs significant for leukocytosis of 16.9.  CT scan obtained showing findings worrisome for acute appendicitis and adjacent abscess formation.  General surgery, Dr. Cliffton Asters, consulted who will admit.   Final Clinical Impressions(s) / ED Diagnoses   Final diagnoses:  Appendicitis with abscess    ED Discharge Orders  None       Ward, Chase Picket, PA-C 03/01/18 0019    Tegeler, Canary Brim, MD 03/01/18 [phone]

## 2018-03-01 ENCOUNTER — Inpatient Hospital Stay (HOSPITAL_COMMUNITY): Payer: 59

## 2018-03-01 DIAGNOSIS — Z791 Long term (current) use of non-steroidal anti-inflammatories (NSAID): Secondary | ICD-10-CM | POA: Diagnosis not present

## 2018-03-01 DIAGNOSIS — K219 Gastro-esophageal reflux disease without esophagitis: Secondary | ICD-10-CM | POA: Diagnosis present

## 2018-03-01 DIAGNOSIS — Z8249 Family history of ischemic heart disease and other diseases of the circulatory system: Secondary | ICD-10-CM | POA: Diagnosis not present

## 2018-03-01 DIAGNOSIS — Z833 Family history of diabetes mellitus: Secondary | ICD-10-CM | POA: Diagnosis not present

## 2018-03-01 DIAGNOSIS — K3533 Acute appendicitis with perforation and localized peritonitis, with abscess: Secondary | ICD-10-CM | POA: Diagnosis present

## 2018-03-01 DIAGNOSIS — J45909 Unspecified asthma, uncomplicated: Secondary | ICD-10-CM | POA: Diagnosis present

## 2018-03-01 DIAGNOSIS — R000 Tachycardia, unspecified: Secondary | ICD-10-CM | POA: Diagnosis present

## 2018-03-01 DIAGNOSIS — Z803 Family history of malignant neoplasm of breast: Secondary | ICD-10-CM | POA: Diagnosis not present

## 2018-03-01 DIAGNOSIS — M419 Scoliosis, unspecified: Secondary | ICD-10-CM | POA: Diagnosis present

## 2018-03-01 DIAGNOSIS — Z79899 Other long term (current) drug therapy: Secondary | ICD-10-CM | POA: Diagnosis not present

## 2018-03-01 DIAGNOSIS — Z885 Allergy status to narcotic agent status: Secondary | ICD-10-CM | POA: Diagnosis not present

## 2018-03-01 DIAGNOSIS — R636 Underweight: Secondary | ICD-10-CM | POA: Diagnosis present

## 2018-03-01 DIAGNOSIS — N736 Female pelvic peritoneal adhesions (postinfective): Secondary | ICD-10-CM | POA: Diagnosis present

## 2018-03-01 DIAGNOSIS — Z8042 Family history of malignant neoplasm of prostate: Secondary | ICD-10-CM | POA: Diagnosis not present

## 2018-03-01 DIAGNOSIS — Z681 Body mass index (BMI) 19 or less, adult: Secondary | ICD-10-CM | POA: Diagnosis not present

## 2018-03-01 DIAGNOSIS — E876 Hypokalemia: Secondary | ICD-10-CM | POA: Diagnosis not present

## 2018-03-01 LAB — MAGNESIUM: MAGNESIUM: 1.9 mg/dL (ref 1.7–2.4)

## 2018-03-01 LAB — BASIC METABOLIC PANEL
Anion gap: 10 (ref 5–15)
CALCIUM: 8 mg/dL — AB (ref 8.9–10.3)
CO2: 18 mmol/L — ABNORMAL LOW (ref 22–32)
Chloride: 107 mmol/L (ref 98–111)
Creatinine, Ser: 0.83 mg/dL (ref 0.44–1.00)
GFR calc Af Amer: >60 mL/min (ref >60)
GFR calc non Af Amer: >60 mL/min (ref >60)
GLUCOSE: 84 mg/dL (ref 70–99)
Potassium: 3.5 mmol/L (ref 3.5–5.1)
Sodium: 135 mmol/L (ref 135–145)

## 2018-03-01 LAB — CBC
HCT: 31.7 % — ABNORMAL LOW (ref 36.0–46.0)
Hemoglobin: 10.7 g/dL — ABNORMAL LOW (ref 12.0–15.0)
MCH: 30.4 pg (ref 26.0–34.0)
MCHC: 33.8 g/dL (ref 30.0–36.0)
MCV: 90.1 fL (ref 78.0–100.0)
Platelets: 215 10*3/uL (ref 150–400)
RBC: 3.52 MIL/uL — ABNORMAL LOW (ref 3.87–5.11)
RDW: 12.7 % (ref 11.5–15.5)
WBC: 13.1 10*3/uL — ABNORMAL HIGH (ref 4.0–10.5)

## 2018-03-01 LAB — HIV ANTIBODY (ROUTINE TESTING W REFLEX): HIV Screen 4th Generation wRfx: NONREACTIVE

## 2018-03-01 LAB — PROTIME-INR
INR: 1.2
Prothrombin Time: 15.1 seconds (ref 11.4–15.2)

## 2018-03-01 LAB — PHOSPHORUS: Phosphorus: 2.6 mg/dL (ref 2.5–4.6)

## 2018-03-01 MED ORDER — ONDANSETRON 4 MG PO TBDP
4.0000 mg | ORAL_TABLET | Freq: Three times a day (TID) | ORAL | Status: DC | PRN
  Administered 2018-03-04 – 2018-03-09 (×2): 4 mg via ORAL
  Filled 2018-03-01 (×3): qty 1

## 2018-03-01 MED ORDER — HEPARIN SODIUM (PORCINE) 5000 UNIT/ML IJ SOLN
5000.0000 [IU] | Freq: Three times a day (TID) | INTRAMUSCULAR | Status: DC
  Administered 2018-03-01: 5000 [IU] via SUBCUTANEOUS
  Filled 2018-03-01: qty 1

## 2018-03-01 MED ORDER — HYDROMORPHONE HCL 1 MG/ML IJ SOLN
0.5000 mg | INTRAMUSCULAR | Status: DC | PRN
  Administered 2018-03-01 – 2018-03-07 (×2): 0.5 mg via INTRAVENOUS
  Filled 2018-03-01: qty 1

## 2018-03-01 MED ORDER — HEPARIN SODIUM (PORCINE) 5000 UNIT/ML IJ SOLN
5000.0000 [IU] | Freq: Three times a day (TID) | INTRAMUSCULAR | Status: AC
  Administered 2018-03-02 – 2018-03-05 (×11): 5000 [IU] via SUBCUTANEOUS
  Filled 2018-03-01 (×11): qty 1

## 2018-03-01 MED ORDER — SODIUM CHLORIDE 0.9 % IV SOLN
INTRAVENOUS | Status: DC | PRN
  Administered 2018-03-01 – 2018-03-02 (×2): 100 mL via INTRAVENOUS

## 2018-03-01 MED ORDER — MIDAZOLAM HCL 2 MG/2ML IJ SOLN
INTRAMUSCULAR | Status: AC
  Filled 2018-03-01: qty 4

## 2018-03-01 MED ORDER — LIDOCAINE HCL 1 % IJ SOLN
INTRAMUSCULAR | Status: AC
  Filled 2018-03-01: qty 20

## 2018-03-01 MED ORDER — LACTATED RINGERS IV BOLUS
1000.0000 mL | Freq: Once | INTRAVENOUS | Status: AC
  Administered 2018-03-01: 1000 mL via INTRAVENOUS

## 2018-03-01 MED ORDER — ONDANSETRON HCL 4 MG/2ML IJ SOLN
4.0000 mg | Freq: Four times a day (QID) | INTRAMUSCULAR | Status: DC | PRN
  Administered 2018-03-02 (×2): 4 mg via INTRAVENOUS
  Filled 2018-03-01 (×2): qty 2

## 2018-03-01 MED ORDER — PIPERACILLIN-TAZOBACTAM 3.375 G IVPB
3.3750 g | Freq: Three times a day (TID) | INTRAVENOUS | Status: DC
  Administered 2018-03-01 – 2018-03-05 (×13): 3.375 g via INTRAVENOUS
  Filled 2018-03-01 (×13): qty 50

## 2018-03-01 MED ORDER — SODIUM CHLORIDE 0.9 % IV SOLN
INTRAVENOUS | Status: AC | PRN
  Administered 2018-03-01: 10 mL/h via INTRAVENOUS

## 2018-03-01 MED ORDER — HYDROMORPHONE HCL 1 MG/ML IJ SOLN
INTRAMUSCULAR | Status: AC
  Administered 2018-03-01: 0.5 mg via INTRAVENOUS
  Filled 2018-03-01: qty 1

## 2018-03-01 MED ORDER — IBUPROFEN 600 MG PO TABS
600.0000 mg | ORAL_TABLET | Freq: Four times a day (QID) | ORAL | Status: DC
  Administered 2018-03-01 – 2018-03-02 (×5): 600 mg via ORAL
  Filled 2018-03-01 (×6): qty 1

## 2018-03-01 MED ORDER — ACETAMINOPHEN 500 MG PO TABS
1000.0000 mg | ORAL_TABLET | Freq: Four times a day (QID) | ORAL | Status: DC
  Administered 2018-03-01 – 2018-03-03 (×4): 1000 mg via ORAL
  Filled 2018-03-01 (×7): qty 2

## 2018-03-01 MED ORDER — MIDAZOLAM HCL 2 MG/2ML IJ SOLN
INTRAMUSCULAR | Status: AC | PRN
  Administered 2018-03-01 (×2): 1 mg via INTRAVENOUS

## 2018-03-01 MED ORDER — LACTATED RINGERS IV SOLN
INTRAVENOUS | Status: DC
  Administered 2018-03-01 – 2018-03-07 (×16): via INTRAVENOUS

## 2018-03-01 MED ORDER — FENTANYL CITRATE (PF) 100 MCG/2ML IJ SOLN
INTRAMUSCULAR | Status: AC | PRN
  Administered 2018-03-01: 50 ug via INTRAVENOUS
  Administered 2018-03-01 (×2): 25 ug via INTRAVENOUS

## 2018-03-01 MED ORDER — SODIUM CHLORIDE 0.9% FLUSH
5.0000 mL | Freq: Three times a day (TID) | INTRAVENOUS | Status: DC
  Administered 2018-03-01 – 2018-03-10 (×23): 5 mL

## 2018-03-01 MED ORDER — TRAMADOL HCL 50 MG PO TABS
50.0000 mg | ORAL_TABLET | Freq: Four times a day (QID) | ORAL | Status: DC | PRN
  Administered 2018-03-01 – 2018-03-07 (×7): 50 mg via ORAL
  Filled 2018-03-01 (×7): qty 1

## 2018-03-01 MED ORDER — FENTANYL CITRATE (PF) 100 MCG/2ML IJ SOLN
INTRAMUSCULAR | Status: AC
  Filled 2018-03-01: qty 2

## 2018-03-01 NOTE — ED Notes (Signed)
Pt is resting and appears comfortable.  She reports her pain is better.  Waiting for a room

## 2018-03-01 NOTE — Progress Notes (Signed)
Patient arrived to the unit via bed from the emergency department.  Patient is alert and oriented x 4.  Skin assessment complete.  No skin issues.  IV intact to the left antecubital. Educated the patient on how to reach the staff on the unit.  Lowered the bed and placed the call light within reach..Marland Kitchen

## 2018-03-01 NOTE — Consult Note (Addendum)
Chief Complaint: Patient was seen in consultation today for intra-abdominal fluid collection  Referring Physician(s): Dr. Abigail Miyamoto  Supervising Physician: Gilmer Mor  Patient Status: [facility] - In-pt  History of Present Illness: Lindsey Tucker is a 32 y.o. female with past medical history of asthma, GERD presented to [facility] ED with abdominal pain, fever.    CT Abdomen Pelvis obtained 02/28/18 and showed: The above findings are worrisome for acute appendicitis and adjacent abscess formation. The suspected distended appendix containing phleboliths measures up to 13 mm in caliber. The suspected abscess measures up to 4.6 cm in diameter. There is no oral contrast therefore it is difficult to differentiate the dilated appendix and adjacent suspected abscess with normal fluid-filled bowel loops. Other words, these abnormalities may simply represent fluid-filled bowel loops. The above findings are therefore indeterminate. Confirmation with a repeat study and oral contrast may be helpful.  IR consulted for aspiration and drainage at the request of Dr. Magnus Ivan.  Case and imaging reviewed by Dr. Loreta Ave.   Patient has been NPO.  She last received heparin at 7AM.   Past Medical History:  Diagnosis Date  . Asthma   . GERD (gastroesophageal reflux disease)   . Postpartum care following cesarean delivery (8/14) 02/02/2016  . Scoliosis   . Vacuum extractor delivery, delivered (12/12) 06/01/2013    Past Surgical History:  Procedure Laterality Date  . CESAREAN SECTION N/A 02/01/2016   Procedure: Primary CESAREAN SECTION;  Surgeon: Noland Fordyce, MD;  Location: [facility] BIRTHING SUITES;  Service: Obstetrics;  Laterality: N/A;  EDD: 02/08/16 *Time ok per Izora Ribas*  . SPINAL GROWTH RODS  2000, 2003  . WISDOM TOOTH EXTRACTION      Allergies: Morphine and related  Medications: Prior to Admission medications   Medication Sig Start Date End Date Taking? Authorizing Provider  loratadine  (CLARITIN) 10 MG tablet Take 10 mg by mouth daily as needed for allergies.    Yes [provider]  naproxen sodium (ALEVE) 220 MG tablet Take 220 mg by mouth 2 (two) times daily as needed (pain).   Yes [provider]  ibuprofen (ADVIL,MOTRIN) 800 MG tablet Take 1 tablet (800 mg total) by mouth every 8 (eight) hours as needed for mild pain, moderate pain or cramping. Patient not taking: Reported on 02/28/2018 02/04/16   [name], CNM     Family History  Problem Relation Age of Onset  . Hypertension Mother   . Hypertension Father   . Diabetes Maternal Grandfather   . Cancer Maternal Grandfather        prostate  . Cancer Paternal Grandmother        breast    Social History   Socioeconomic History  . Marital status: Married    Spouse name: Not on file  . Number of children: Not on file  . Years of education: Not on file  . Highest education level: Not on file  Occupational History  . Not on file  Social Needs  . Financial resource strain: Not on file  . Food insecurity:    Worry: Not on file    Inability: Not on file  . Transportation needs:    Medical: Not on file    Non-medical: Not on file  Tobacco Use  . Smoking status: Never Smoker  . Smokeless tobacco: Never Used  Substance and Sexual Activity  . Alcohol use: Yes    Alcohol/week: 2.0 standard drinks    Types: 2 Shots of liquor per week  . Drug use:  No  . Sexual activity: Yes    Birth control/protection: Pill  Lifestyle  . Physical activity:    Days per week: Not on file    Minutes per session: Not on file  . Stress: Not on file  Relationships  . Social connections:    Talks on phone: Not on file    Gets together: Not on file    Attends religious service: Not on file    Active member of club or organization: Not on file    Attends meetings of clubs or organizations: Not on file    Relationship status: Not on file  Other Topics Concern  . Not on file  Social History Narrative  . Not  on file   Review of Systems: A 12 point ROS discussed and pertinent positives are indicated in the HPI above.  All other systems are negative.  Review of Systems  Constitutional: Negative for fatigue and fever.  Respiratory: Negative for cough and shortness of breath.   Cardiovascular: Negative for chest pain.  Gastrointestinal: Negative for abdominal pain.  Musculoskeletal: Negative for back pain.  Psychiatric/Behavioral: Negative for behavioral problems and confusion.    Vital Signs: BP 100/66 (BP Location: Right Arm)   Pulse (!) 109   Temp 99.4 F (37.4 C) (Oral)   Resp 20   Ht 5' (1.524 m)   Wt 88 lb (39.9 kg)   LMP 01/28/2018   SpO2 98%   BMI 17.19 kg/m   Physical Exam  Constitutional: She is oriented to person, place, and time. She appears well-developed. She does not appear ill. No distress.  Cardiovascular: Normal rate, regular rhythm and normal heart sounds. Exam reveals no gallop and no friction rub.  No murmur heard. Pulmonary/Chest: Effort normal and breath sounds normal. No respiratory distress.  Abdominal: Soft. She exhibits no distension. There is generalized tenderness.  Neurological: She is alert and oriented to person, place, and time.  Skin: Skin is warm and dry.  Psychiatric: She has a normal mood and affect. Her behavior is normal. Judgment and thought content normal.  Nursing note and vitals reviewed.   MD Evaluation Airway: WNL Heart: WNL Abdomen: WNL Chest/ Lungs: WNL ASA  Classification: 3 Mallampati/Airway Score: One   Imaging: Ct Abdomen Pelvis W Contrast  Result Date: 02/28/2018 CLINICAL DATA:  Lower abdominal pain, nausea, vomiting, diarrhea, fever for 4 days EXAM: CT ABDOMEN AND PELVIS WITH CONTRAST TECHNIQUE: Multidetector CT imaging of the abdomen and pelvis was performed using the standard protocol following bolus administration of intravenous contrast. CONTRAST:  100mL OMNIPAQUE IOHEXOL 300 MG/ML  SOLN COMPARISON:  None. FINDINGS:  Lower chest: No acute abnormality. Hepatobiliary: Small subcentimeter left lobe hypodensities are nonspecific. Mild pericholecystic fluid. No gallstones. No obvious gallbladder wall thickening. Pancreas: Unremarkable Spleen: Unremarkable Adrenals/Urinary Tract: Kidneys and adrenal glands are unremarkable. Stomach/Bowel: There is no enteric contrast within bowel loops. Multiple fluid-filled small and large bowel loops are present in the abdomen and pelvis. This limits evaluation for abscess and perforation. There is a tubular structure containing gas and 2 calcific densities in the upper pelvis on image 51 through 60. It measures up to 13 mm in caliber and is worrisome for a distended appendix. There is a fluid and gas filled structure to the right of this tubular structure measuring 2.7 x 4.6 cm containing bubbles of gas. It appears blind ending and is worrisome for an abscess. It is difficult to differentiate this from a bowel loop without oral contrast. Vascular/Lymphatic: No evidence of aortic  aneurysm. No abnormal retroperitoneal adenopathy. The right ovarian vein is distended. 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 Reproductive: Uterus is within normal limits. Other: There is free fluid layering in the pelvis surrounding the distal sigmoid and rectum. Musculoskeletal: Thoracolumbar fusion hardware is in place. Severe scoliosis is present. IMPRESSION: The above findings are worrisome for acute appendicitis and adjacent abscess formation. The suspected distended appendix containing phleboliths measures up to 13 mm in caliber. The suspected abscess measures up to 4.6 cm in diameter. There is no oral contrast therefore it is difficult to differentiate the dilated appendix and adjacent suspected abscess with normal fluid-filled bowel loops. Other words, these abnormalities may simply represent fluid-filled bowel loops. The above findings are therefore indeterminate. Confirmation with a repeat study and oral  contrast may be helpful. Critical Value/emergent results were called by telephone at the time of interpretation on 02/28/2018 at 9:20 pm to Dr. Elizabeth Sauer , who verbally acknowledged these results. Free-fluid in the pelvis. 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 Scoliosis and postoperative changes in the lumbar spine. Indeterminate subcentimeter liver hypodensities. If there is risk or history of malignancy, consider follow-up MRI with contrast. Electronically Signed   By: Jolaine Click M.D.   On: 02/28/2018 21:26    Labs:  CBC: Recent Labs    02/28/18 1435 03/01/18 0637  WBC 16.9* 13.1*  HGB 12.5 10.7*  HCT 37.4 31.7*  PLT 289 215    COAGS: Recent Labs    03/01/18 1023  INR 1.20    BMP: Recent Labs    02/28/18 1435 03/01/18 0637  NA 136 135  K 3.5 3.5  CL 105 107  CO2 20* 18*  GLUCOSE 118* 84  BUN 9 <5*  CALCIUM 8.6* 8.0*  CREATININE 0.81 0.83  GFRNONAA >60 >60  GFRAA >60 >60    LIVER FUNCTION TESTS: Recent Labs    02/28/18 1435  BILITOT 0.7  AST 18  ALT 13  ALKPHOS 88  PROT 7.0  ALBUMIN 3.6    TUMOR MARKERS: No results for input(s): AFPTM, CEA, CA199, CHROMGRNA in the last 8760 hours.  Assessment and Plan: Intra-abdominal fluid collection, suspected perforated appendicitis with periappendiceal abscess.  Patient with CT Abdomen/Pelvis showing multiple fluid collections.   Reviewed imaging with Dr. Loreta Ave who notes the collection adjacent to the appendix is not amenable to percutaneous drainage.  She does have a posterior collection which is approachable for TG drain.  Discussed with surgery team who wish to move forward with drainage.  Patient has been NPO as of this AM.  Her afternoon heparin dose has been held.  Plan to proceed with drain placement as schedule allows.   Risks and benefits discussed with the patient including bleeding, infection, damage to adjacent structures, bowel perforation/fistula connection, and sepsis.  All of  the patient's questions were answered, patient is agreeable to proceed. Consent signed and in chart.  Thank you for this interesting consult.  I greatly enjoyed meeting Lindsey Tucker and look forward to participating in their care.  A copy of this report was sent to the requesting provider on this date.  Electronically Signed: Hoyt Koch, PA 03/01/2018, 2:02 PM   I spent a total of 40 Minutes    in face to face in clinical consultation, greater than 50% of which was counseling/coordinating care for intra-abdominal fluid collection.

## 2018-03-01 NOTE — Progress Notes (Signed)
[facility]/Trauma Progress Note      Assessment/Plan  GERD Asthma Scoliosis  likely perforated appendicitis with periappendiceal abscess measuring 4.6 x 2.7cm in diameter - consult to IR pending for possible drainage  FEN: NPO, IVF VTE: SCD's, heparin ID: Zosyn 09/11>> Foley: none Follow up: TBD  DISPO: IR consul pending, continue IV abx, NPO and IVF    LOS: 0 days    Subjective: CC: abdominal pain  Pt states she started her period this am. Having flatus. No BM since admission. Had nausea overnight without vomiting. Husband at bedside.   Objective: Vital signs in last 24 hours: Temp:  [98.5 F (36.9 C)-103 F (39.4 C)] 101.4 F (38.6 C) (09/12 0807) Pulse Rate:  [103-131] 112 (09/12 0807) Resp:  [16-24] 20 (09/12 0807) BP: (88-115)/(55-81) 94/55 (09/12 0807) SpO2:  [98 %-100 %] 98 % (09/12 0807) Weight:  [39.6 kg-39.9 kg] 39.9 kg (09/11 1422)    Intake/Output from previous day: 09/11 0701 - 09/12 0700 In: 1050 [IV Piggyback:1050] Out: -  Intake/Output this shift: No intake/output data recorded.  PE: Gen:  Alert, NAD, pleasant, cooperative Card:  Mild tachycardia, no M/G/R heard Pulm:  CTA, no W/R/R, rate and effort normal Abd: Soft, ND, +BS, moderate generalized TTP with guarding, no peritonitis Skin: no rashes noted, warm and dry   Anti-infectives: Anti-infectives (From admission, onward)   Start     Dose/Rate Route Frequency Ordered Stop   03/01/18 0630  piperacillin-tazobactam (ZOSYN) IVPB 3.375 g     3.375 g 12.5 mL/hr over 240 Minutes Intravenous Every 8 hours 03/01/18 0619     02/28/18 2200  piperacillin-tazobactam (ZOSYN) IVPB 3.375 g     3.375 g 100 mL/hr over 30 Minutes Intravenous  Once 02/28/18 2154 02/28/18 2347      Lab Results:  Recent Labs    02/28/18 1435 03/01/18 0637  WBC 16.9* 13.1*  HGB 12.5 10.7*  HCT 37.4 31.7*  PLT 289 215   BMET Recent Labs    02/28/18 1435 03/01/18 0637  NA 136 135  K 3.5 3.5   CL 105 107  CO2 20* 18*  GLUCOSE 118* 84  BUN 9 <5*  CREATININE 0.81 0.83  CALCIUM 8.6* 8.0*   PT/INR No results for input(s): LABPROT, INR in the last 72 hours. CMP     Component Value Date/Time   NA 135 03/01/2018 0637   K 3.5 03/01/2018 0637   CL 107 03/01/2018 0637   CO2 18 (L) 03/01/2018 0637   GLUCOSE 84 03/01/2018 0637   BUN <5 (L) 03/01/2018 0637   CREATININE 0.83 03/01/2018 0637   CALCIUM 8.0 (L) 03/01/2018 0637   PROT 7.0 02/28/2018 1435   ALBUMIN 3.6 02/28/2018 1435   AST 18 02/28/2018 1435   ALT 13 02/28/2018 1435   ALKPHOS 88 02/28/2018 1435   BILITOT 0.7 02/28/2018 1435   GFRNONAA >60 03/01/2018 0637   GFRAA >60 03/01/2018 0637   Lipase     Component Value Date/Time   LIPASE 26 02/28/2018 1435    Studies/Results: Ct Abdomen Pelvis W Contrast  Result Date: 02/28/2018 CLINICAL DATA:  Lower abdominal pain, nausea, vomiting, diarrhea, fever for 4 days EXAM: CT ABDOMEN AND PELVIS WITH CONTRAST TECHNIQUE: Multidetector CT imaging of the abdomen and pelvis was performed using the standard protocol following bolus administration of intravenous contrast. CONTRAST:  OMNIPAQUE IOHEXOL 300 MG/ML  SOLN COMPARISON:  None. FINDINGS: Lower chest: No acute abnormality. Hepatobiliary: Small subcentimeter left lobe hypodensities are nonspecific. Mild pericholecystic fluid.  No gallstones. No obvious gallbladder wall thickening. Pancreas: Unremarkable Spleen: Unremarkable Adrenals/Urinary Tract: Kidneys and adrenal glands are unremarkable. Stomach/Bowel: There is no enteric contrast within bowel loops. Multiple fluid-filled small and large bowel loops are present in the abdomen and pelvis. This limits evaluation for abscess and perforation. There is a tubular structure containing gas and 2 calcific densities in the upper pelvis on image 51 through 60. It measures up to 13 mm in caliber and is worrisome for a distended appendix. There is a fluid and gas filled structure to the  right of this tubular structure measuring 2.7 x 4.6 cm containing bubbles of gas. It appears blind ending and is worrisome for an abscess. It is difficult to differentiate this from a bowel loop without oral contrast. Vascular/Lymphatic: No evidence of aortic aneurysm. No abnormal retroperitoneal adenopathy. The right ovarian vein is distended. 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 Reproductive: Uterus is within normal limits. Other: There is free fluid layering in the pelvis surrounding the distal sigmoid and rectum. Musculoskeletal: Thoracolumbar fusion hardware is in place. Severe scoliosis is present. IMPRESSION: The above findings are worrisome for acute appendicitis and adjacent abscess formation. The suspected distended appendix containing phleboliths measures up to 13 mm in caliber. The suspected abscess measures up to 4.6 cm in diameter. There is no oral contrast therefore it is difficult to differentiate the dilated appendix and adjacent suspected abscess with normal fluid-filled bowel loops. Other words, these abnormalities may simply represent fluid-filled bowel loops. The above findings are therefore indeterminate. Confirmation with a repeat study and oral contrast may be helpful. Critical Value/emergent results were called by telephone at the time of interpretation on 02/28/2018 at 9:20 pm to Dr. Elizabeth SauerJAIME WARD , who verbally acknowledged these results. Free-fluid in the pelvis. 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 Scoliosis and postoperative changes in the lumbar spine. Indeterminate subcentimeter liver hypodensities. If there is risk or history of malignancy, consider follow-up MRI with contrast. Electronically Signed   By: Jolaine ClickArthur  Hoss M.D.   On: 02/28/2018 21:26      Jerre SimonJessica L Liya Strollo , [facility] 03/01/2018, 9:52 AM  Pager: [phone] Mon-Wed, Friday 7:00am-4:30pm Thurs 7am-11:30am  Consults: [phone]

## 2018-03-01 NOTE — Procedures (Signed)
Interventional Radiology Procedure Note  Procedure: Left transgluteal 51F drain into pelvic fluid. To bulb suction.  ~40cc of thin serosanguinous fluid.  Cx sent.  Complications: None Recommendations:  - Routine drain care - Do not submerge - Routine wound care  - follow up drain clinic as needed  Signed,  Yvone NeuJaime S. Loreta AveWagner, DO

## 2018-03-01 NOTE — H&P (Signed)
CC/Reason for consult: Perforated appendicitis with abscess, consult by EDP  HPI: Lindsey Tucker is an 32 y.o. female with hx of scoliosis presents to ED with 4d hx of abdominal discomfort - initially thought it was menstrual cramps but didn't improve and persisted. She developed significant diarrhea - mutliple loose watery BMs over the last 3d as well. She had fever/chills. She presented for further evaluation. She has never had this before. Nothing makes pain better/worse. She reports the pain is in the right lower abdomen and periumbilical. Pain does not radiate. Pain is achy/dull. Denies left sided abdominal pain or upper quadrant abdominal pain. Nausea today; no emesis since yesterday. Having multiple loose BMs today.  Past Medical History:  Diagnosis Date  . Asthma   . GERD (gastroesophageal reflux disease)   . Postpartum care following cesarean delivery (8/14) 02/02/2016  . Scoliosis   . Vacuum extractor delivery, delivered (12/12) 06/01/2013    Past Surgical History:  Procedure Laterality Date  . CESAREAN SECTION N/A 02/01/2016   Procedure: Primary CESAREAN SECTION;  Surgeon: Aloha Gell, MD;  Location: [place];  Service: Obstetrics;  Laterality: N/A;  EDD: 02/08/16 *Time ok per Lenon Curt*  . SPINAL GROWTH RODS  2000, 2003  . WISDOM TOOTH EXTRACTION      Family History  Problem Relation Age of Onset  . Hypertension Mother   . Hypertension Father   . Diabetes Maternal Grandfather   . Cancer Maternal Grandfather        prostate  . Cancer Paternal Grandmother        breast    Social:  reports that she has never smoked. She has never used smokeless tobacco. She reports that she drinks about 2.0 standard drinks of alcohol per week. She reports that she does not use drugs.  Allergies:  Allergies  Allergen Reactions  . Morphine And Related Itching and Swelling    Medications: I have reviewed the patient's current medications.  Results for orders placed or  performed during the hospital encounter of 02/28/18 (from the past 48 hour(s))  Lipase, blood     Status: None   Collection Time: 02/28/18  2:35 PM  Result Value Ref Range   Lipase 26 11 - 51 U/L    Comment: Performed at [facility], [place] [address] [place], [place] [address]  Comprehensive metabolic panel     Status: Abnormal   Collection Time: 02/28/18  2:35 PM  Result Value Ref Range   Sodium 136 135 - 145 mmol/L   Potassium 3.5 3.5 - 5.1 mmol/L   Chloride 105 98 - 111 mmol/L   CO2 20 (L) 22 - 32 mmol/L   Glucose, Bld 118 (H) 70 - 99 mg/dL   BUN 9 6 - 20 mg/dL   Creatinine, Ser 0.81 0.44 - 1.00 mg/dL   Calcium 8.6 (L) 8.9 - 10.3 mg/dL   Total Protein 7.0 6.5 - 8.1 g/dL   Albumin 3.6 3.5 - 5.0 g/dL   AST 18 15 - 41 U/L   ALT 13 0 - 44 U/L   Alkaline Phosphatase 88 38 - 126 U/L   Total Bilirubin 0.7 0.3 - 1.2 mg/dL   GFR calc non Af Amer >60 >60 mL/min   GFR calc Af Amer >60 >60 mL/min    Comment: (NOTE) The eGFR has been calculated using the CKD EPI equation. This calculation has not been validated in all clinical situations. eGFR's persistently <60 mL/min signify possible Chronic Kidney Disease.    Anion  gap 11 5 - 15    Comment: Performed at [facility], [place] [address] [place], [place] [address]  CBC     Status: Abnormal   Collection Time: 02/28/18  2:35 PM  Result Value Ref Range   WBC 16.9 (H) 4.0 - 10.5 K/uL   RBC 4.07 3.87 - 5.11 MIL/uL   Hemoglobin 12.5 12.0 - 15.0 g/dL   HCT 37.4 36.0 - 46.0 %   MCV 91.9 78.0 - 100.0 fL   MCH 30.7 26.0 - 34.0 pg   MCHC 33.4 30.0 - 36.0 g/dL   RDW 12.7 11.5 - 15.5 %   Platelets 289 150 - 400 K/uL    Comment: Performed at [facility], [place] [address], [place], [place] [address]  hCG, quantitative, pregnancy     Status: None   Collection Time: 02/28/18  2:35 PM  Result Value Ref Range   hCG, Beta Chain, Quant, S <1 <5 mIU/mL    Comment:          GEST. AGE      CONC.  (mIU/mL)   <=1 WEEK        5 - 50      2 WEEKS       50 - 500     3 WEEKS       100 - 10,000     4 WEEKS     1,000 - 30,000     5 WEEKS     3,500 - 115,000   6-8 WEEKS     12,000 - 270,000    12 WEEKS     15,000 - 220,000        FEMALE AND NON-PREGNANT FEMALE:     LESS THAN 5 mIU/mL Performed at [facility], [place] [address], [place],  [address]   I-Stat beta hCG blood, ED     Status: Abnormal   Collection Time: 02/28/18  2:53 PM  Result Value Ref Range   I-stat hCG, quantitative 18.3 (H) <5 mIU/mL   Comment 3            Comment:   GEST. AGE      CONC.  (mIU/mL)   <=1 WEEK        5 - 50     2 WEEKS       50 - 500     3 WEEKS       100 - 10,000     4 WEEKS     1,000 - 30,000        FEMALE AND NON-PREGNANT FEMALE:     LESS THAN 5 mIU/mL   Urinalysis, Routine w reflex microscopic     Status: Abnormal   Collection Time: 02/28/18  6:30 PM  Result Value Ref Range   Color, Urine YELLOW YELLOW   APPearance HAZY (A) CLEAR   Specific Gravity, Urine 1.034 (H) 1.005 - 1.030   pH 5.0 5.0 - 8.0   Glucose, UA NEGATIVE NEGATIVE mg/dL   Hgb urine dipstick NEGATIVE NEGATIVE   Bilirubin Urine NEGATIVE NEGATIVE   Ketones, ur 80 (A) NEGATIVE mg/dL   Protein, ur 100 (A) NEGATIVE mg/dL   Nitrite NEGATIVE NEGATIVE   Leukocytes, UA NEGATIVE NEGATIVE   RBC / HPF 0-5 0 - 5 RBC/hpf   WBC, UA 6-10 0 - 5 WBC/hpf   Bacteria, UA RARE (A) NONE SEEN   Squamous Epithelial / LPF 11-20 0 - 5   Mucus PRESENT  Comment: Performed at [facility], [place] [address], [place], [place] [address]  POC Urine Pregnancy, ED (do NOT order at [facility])     Status: None   Collection Time: 02/28/18  6:35 PM  Result Value Ref Range   Preg Test, Ur NEGATIVE NEGATIVE    Comment:        THE SENSITIVITY OF THIS METHODOLOGY IS >24 mIU/mL     Ct Abdomen Pelvis W Contrast  Result Date: 02/28/2018 CLINICAL DATA:  Lower abdominal pain, nausea, vomiting, diarrhea, fever for 4 days EXAM: CT ABDOMEN AND PELVIS WITH CONTRAST TECHNIQUE: Multidetector CT  imaging of the abdomen and pelvis was performed using the standard protocol following bolus administration of intravenous contrast. CONTRAST:  174m OMNIPAQUE IOHEXOL 300 MG/ML  SOLN COMPARISON:  None. FINDINGS: Lower chest: No acute abnormality. Hepatobiliary: Small subcentimeter left lobe hypodensities are nonspecific. Mild pericholecystic fluid. No gallstones. No obvious gallbladder wall thickening. Pancreas: Unremarkable Spleen: Unremarkable Adrenals/Urinary Tract: Kidneys and adrenal glands are unremarkable. Stomach/Bowel: There is no enteric contrast within bowel loops. Multiple fluid-filled small and large bowel loops are present in the abdomen and pelvis. This limits evaluation for abscess and perforation. There is a tubular structure containing gas and 2 calcific densities in the upper pelvis on image 51 through 60. It measures up to 13 mm in caliber and is worrisome for a distended appendix. There is a fluid and gas filled structure to the right of this tubular structure measuring 2.7 x 4.6 cm containing bubbles of gas. It appears blind ending and is worrisome for an abscess. It is difficult to differentiate this from a bowel loop without oral contrast. Vascular/Lymphatic: No evidence of aortic aneurysm. No abnormal retroperitoneal adenopathy. The right ovarian vein is distended. 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 Reproductive: Uterus is within normal limits. Other: There is free fluid layering in the pelvis surrounding the distal sigmoid and rectum. Musculoskeletal: Thoracolumbar fusion hardware is in place. Severe scoliosis is present. IMPRESSION: The above findings are worrisome for acute appendicitis and adjacent abscess formation. The suspected distended appendix containing phleboliths measures up to 13 mm in caliber. The suspected abscess measures up to 4.6 cm in diameter. There is no oral contrast therefore it is difficult to differentiate the dilated appendix and adjacent suspected abscess  with normal fluid-filled bowel loops. Other words, these abnormalities may simply represent fluid-filled bowel loops. The above findings are therefore indeterminate. Confirmation with a repeat study and oral contrast may be helpful. Critical Value/emergent results were called by telephone at the time of interpretation on 02/28/2018 at 9:20 pm to Dr. JPearlie Oyster, who verbally acknowledged these results. Free-fluid in the pelvis. 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 Scoliosis and postoperative changes in the lumbar spine. Indeterminate subcentimeter liver hypodensities. If there is risk or history of malignancy, consider follow-up MRI with contrast. Electronically Signed   By: AMarybelle KillingsM.D.   On: 02/28/2018 21:26    ROS - all of the below systems have been reviewed with the patient and positives are indicated with bold text General: chills, fever or night sweats Eyes: blurry vision or double vision ENT: epistaxis or sore throat Allergy/Immunology: itchy/watery eyes or nasal congestion Hematologic/Lymphatic: bleeding problems, blood clots or swollen lymph nodes Endocrine: temperature intolerance or unexpected weight changes Breast: new or changing breast lumps or nipple discharge Resp: cough, shortness of breath, or wheezing CV: chest pain or dyspnea on exertion GI: as per HPI GU: dysuria, trouble voiding, or hematuria MSK: joint  pain or joint stiffness Neuro: TIA or stroke symptoms Derm: pruritus and skin lesion changes Psych: anxiety and depression  PE Blood pressure 110/70, pulse (!) 112, temperature 99.5 F (37.5 C), temperature source Oral, resp. rate 18, height 5' (1.524 m), weight 39.9 kg, last menstrual period 01/28/2018, SpO2 100 %, unknown if currently breastfeeding. Constitutional: NAD; conversant; no deformities Eyes: Moist conjunctiva; no lid lag; anicteric; PERRL Neck: Trachea midline; no thyromegaly Lungs: Normal respiratory effort; no tactile fremitus CV:  RRR; no palpable thrills; no pitting edema GI: Abd soft, mildly distended; ttp in RLQ and central hypogastrium. No rebound. No guarding. No tenderness elsewhere in abdomen. No palpable hepatosplenomegaly MSK: Normal gait; no clubbing/cyanosis Psychiatric: Appropriate affect; alert and oriented x3 Lymphatic: No palpable cervical or axillary lymphadenopathy  Results for orders placed or performed during the hospital encounter of 02/28/18 (from the past 48 hour(s))  Lipase, blood     Status: None   Collection Time: 02/28/18  2:35 PM  Result Value Ref Range   Lipase 26 11 - 51 U/L    Comment: Performed at [facility], [place] [address], [place], [place] [address]  Comprehensive metabolic panel     Status: Abnormal   Collection Time: 02/28/18  2:35 PM  Result Value Ref Range   Sodium 136 135 - 145 mmol/L   Potassium 3.5 3.5 - 5.1 mmol/L   Chloride 105 98 - 111 mmol/L   CO2 20 (L) 22 - 32 mmol/L   Glucose, Bld 118 (H) 70 - 99 mg/dL   BUN 9 6 - 20 mg/dL   Creatinine, Ser 0.81 0.44 - 1.00 mg/dL   Calcium 8.6 (L) 8.9 - 10.3 mg/dL   Total Protein 7.0 6.5 - 8.1 g/dL   Albumin 3.6 3.5 - 5.0 g/dL   AST 18 15 - 41 U/L   ALT 13 0 - 44 U/L   Alkaline Phosphatase 88 38 - 126 U/L   Total Bilirubin 0.7 0.3 - 1.2 mg/dL   GFR calc non Af Amer >60 >60 mL/min   GFR calc Af Amer >60 >60 mL/min    Comment: (NOTE) The eGFR has been calculated using the CKD EPI equation. This calculation has not been validated in all clinical situations. eGFR's persistently <60 mL/min signify possible Chronic Kidney Disease.    Anion gap 11 5 - 15    Comment: Performed at [facility] [address] [place], [place] [address]  CBC     Status: Abnormal   Collection Time: 02/28/18  2:35 PM  Result Value Ref Range   WBC 16.9 (H) 4.0 - 10.5 K/uL   RBC 4.07 3.87 - 5.11 MIL/uL   Hemoglobin 12.5 12.0 - 15.0 g/dL   HCT 37.4 36.0 - 46.0 %   MCV 91.9 78.0 - 100.0 fL   MCH 30.7 26.0 - 34.0 pg   MCHC 33.4 30.0 -  36.0 g/dL   RDW 12.7 11.5 - 15.5 %   Platelets 289 150 - 400 K/uL    Comment: Performed at [facility], [place] [address] [place],  [address]  hCG, quantitative, pregnancy     Status: None   Collection Time: 02/28/18  2:35 PM  Result Value Ref Range   hCG, Beta Chain, Quant, S <1 <5 mIU/mL    Comment:          GEST. AGE      CONC.  (mIU/mL)   <=1 WEEK        5 -  50     2 WEEKS       50 - 500     3 WEEKS       100 - 10,000     4 WEEKS     1,000 - 30,000     5 WEEKS     3,500 - 115,000   6-8 WEEKS     12,000 - 270,000    12 WEEKS     15,000 - 220,000        FEMALE AND NON-PREGNANT FEMALE:     LESS THAN 5 mIU/mL Performed at [facility], [place] [address] [place], [place] [address]   I-Stat beta hCG blood, ED     Status: Abnormal   Collection Time: 02/28/18  2:53 PM  Result Value Ref Range   I-stat hCG, quantitative 18.3 (H) <5 mIU/mL   Comment 3            Comment:   GEST. AGE      CONC.  (mIU/mL)   <=1 WEEK        5 - 50     2 WEEKS       50 - 500     3 WEEKS       100 - 10,000     4 WEEKS     1,000 - 30,000        FEMALE AND NON-PREGNANT FEMALE:     LESS THAN 5 mIU/mL   Urinalysis, Routine w reflex microscopic     Status: Abnormal   Collection Time: 02/28/18  6:30 PM  Result Value Ref Range   Color, Urine YELLOW YELLOW   APPearance HAZY (A) CLEAR   Specific Gravity, Urine 1.034 (H) 1.005 - 1.030   pH 5.0 5.0 - 8.0   Glucose, UA NEGATIVE NEGATIVE mg/dL   Hgb urine dipstick NEGATIVE NEGATIVE   Bilirubin Urine NEGATIVE NEGATIVE   Ketones, ur 80 (A) NEGATIVE mg/dL   Protein, ur 100 (A) NEGATIVE mg/dL   Nitrite NEGATIVE NEGATIVE   Leukocytes, UA NEGATIVE NEGATIVE   RBC / HPF 0-5 0 - 5 RBC/hpf   WBC, UA 6-10 0 - 5 WBC/hpf   Bacteria, UA RARE (A) NONE SEEN   Squamous Epithelial / LPF 11-20 0 - 5   Mucus PRESENT     Comment: Performed at [facility], [place] [address], [place], [place] [address]  POC Urine Pregnancy, ED (do NOT order at [facility])      Status: None   Collection Time: 02/28/18  6:35 PM  Result Value Ref Range   Preg Test, Ur NEGATIVE NEGATIVE    Comment:        THE SENSITIVITY OF THIS METHODOLOGY IS >24 mIU/mL     Ct Abdomen Pelvis W Contrast  Result Date: 02/28/2018 CLINICAL DATA:  Lower abdominal pain, nausea, vomiting, diarrhea, fever for 4 days EXAM: CT ABDOMEN AND PELVIS WITH CONTRAST TECHNIQUE: Multidetector CT imaging of the abdomen and pelvis was performed using the standard protocol following bolus administration of intravenous contrast. CONTRAST:  116m OMNIPAQUE IOHEXOL 300 MG/ML  SOLN COMPARISON:  None. FINDINGS: Lower chest: No acute abnormality. Hepatobiliary: Small subcentimeter left lobe hypodensities are nonspecific. Mild pericholecystic fluid. No gallstones. No obvious gallbladder wall thickening. Pancreas: Unremarkable Spleen: Unremarkable Adrenals/Urinary Tract: Kidneys and adrenal glands are unremarkable. Stomach/Bowel: There is no enteric contrast within bowel loops. Multiple fluid-filled small and large bowel loops are present in the abdomen and pelvis. This limits evaluation for abscess and perforation.  There is a tubular structure containing gas and 2 calcific densities in the upper pelvis on image 51 through 60. It measures up to 13 mm in caliber and is worrisome for a distended appendix. There is a fluid and gas filled structure to the right of this tubular structure measuring 2.7 x 4.6 cm containing bubbles of gas. It appears blind ending and is worrisome for an abscess. It is difficult to differentiate this from a bowel loop without oral contrast. Vascular/Lymphatic: No evidence of aortic aneurysm. No abnormal retroperitoneal adenopathy. The right ovarian vein is distended. 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 Reproductive: Uterus is within normal limits. Other: There is free fluid layering in the pelvis surrounding the distal sigmoid and rectum. Musculoskeletal: Thoracolumbar fusion hardware is in place.  Severe scoliosis is present. IMPRESSION: The above findings are worrisome for acute appendicitis and adjacent abscess formation. The suspected distended appendix containing phleboliths measures up to 13 mm in caliber. The suspected abscess measures up to 4.6 cm in diameter. There is no oral contrast therefore it is difficult to differentiate the dilated appendix and adjacent suspected abscess with normal fluid-filled bowel loops. Other words, these abnormalities may simply represent fluid-filled bowel loops. The above findings are therefore indeterminate. Confirmation with a repeat study and oral contrast may be helpful. Critical Value/emergent results were called by telephone at the time of interpretation on 02/28/2018 at 9:20 pm to Dr. Pearlie Oyster , who verbally acknowledged these results. Free-fluid in the pelvis. 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 Scoliosis and postoperative changes in the lumbar spine. Indeterminate subcentimeter liver hypodensities. If there is risk or history of malignancy, consider follow-up MRI with contrast. Electronically Signed   By: Marybelle Killings M.D.   On: 02/28/2018 21:26    A/P: DELYLA SANDEEN is an 32 y.o. female with likely perforated appendicitis with periappendiceal abscess measuring 4.6 x 2.7cm in diameter  -Admit to surgery -NPO, MIVF - tachycardia likely 2/2 volume losses in addition to septic insult; will give additional liter of fluid -IV Zosyn -Will place consult for IR for evaluation for possible drainage - may need to augment with PO contrast first, will defer to their discretion -PPx: SQH, SCDs   Sharon Mt. Dema Severin, M.D. [facility]

## 2018-03-01 NOTE — ED Notes (Signed)
Family at bedside. 

## 2018-03-02 LAB — CBC
HCT: 31.4 % — ABNORMAL LOW (ref 36.0–46.0)
Hemoglobin: 10.6 g/dL — ABNORMAL LOW (ref 12.0–15.0)
MCH: 30.5 pg (ref 26.0–34.0)
MCHC: 33.8 g/dL (ref 30.0–36.0)
MCV: 90.2 fL (ref 78.0–100.0)
PLATELETS: 209 10*3/uL (ref 150–400)
RBC: 3.48 MIL/uL — ABNORMAL LOW (ref 3.87–5.11)
RDW: 12.9 % (ref 11.5–15.5)
WBC: 7.5 10*3/uL (ref 4.0–10.5)

## 2018-03-02 NOTE — Progress Notes (Signed)
Subjective/Chief Complaint: Much more comfortable today   Objective: Vital signs in last 24 hours: Temp:  [98.5 F (36.9 C)-99.5 F (37.5 C)] 98.5 F (36.9 C) (09/13 0504) Pulse Rate:  [83-109] 83 (09/13 0504) Resp:  [12-20] 18 (09/13 0504) BP: (90-104)/(56-76) 94/61 (09/13 0504) SpO2:  [98 %-100 %] 98 % (09/13 0504) Last BM Date: 03/01/18  Intake/Output from previous day: 09/12 0701 - 09/13 0700 In: 2574.8 [P.O.:120; I.V.:2330.7; IV Piggyback:124.1] Out: 180 [Drains:80] Intake/Output this shift: Total I/O In: 491.5 [I.V.:491.5] Out: -   Exam: Looks better Abdomen soft, minimally tender Drain purulent  Lab Results:  Recent Labs    02/28/18 1435 03/01/18 0637  WBC 16.9* 13.1*  HGB 12.5 10.7*  HCT 37.4 31.7*  PLT 289 215   BMET Recent Labs    02/28/18 1435 03/01/18 0637  NA 136 135  K 3.5 3.5  CL 105 107  CO2 20* 18*  GLUCOSE 118* 84  BUN 9 <5*  CREATININE 0.81 0.83  CALCIUM 8.6* 8.0*   PT/INR Recent Labs    03/01/18 1023  LABPROT 15.1  INR 1.20   ABG No results for input(s): PHART, HCO3 in the last 72 hours.  Invalid input(s): PCO2, PO2  Studies/Results: Ct Abdomen Pelvis W Contrast  Result Date: 02/28/2018 CLINICAL DATA:  Lower abdominal pain, nausea, vomiting, diarrhea, fever for 4 days EXAM: CT ABDOMEN AND PELVIS WITH CONTRAST TECHNIQUE: Multidetector CT imaging of the abdomen and pelvis was performed using the standard protocol following bolus administration of intravenous contrast. CONTRAST:  100mL OMNIPAQUE IOHEXOL 300 MG/ML  SOLN COMPARISON:  None. FINDINGS: Lower chest: No acute abnormality. Hepatobiliary: Small subcentimeter left lobe hypodensities are nonspecific. Mild pericholecystic fluid. No gallstones. No obvious gallbladder wall thickening. Pancreas: Unremarkable Spleen: Unremarkable Adrenals/Urinary Tract: Kidneys and adrenal glands are unremarkable. Stomach/Bowel: There is no enteric contrast within bowel loops. Multiple  fluid-filled small and large bowel loops are present in the abdomen and pelvis. This limits evaluation for abscess and perforation. There is a tubular structure containing gas and 2 calcific densities in the upper pelvis on image 51 through 60. It measures up to 13 mm in caliber and is worrisome for a distended appendix. There is a fluid and gas filled structure to the right of this tubular structure measuring 2.7 x 4.6 cm containing bubbles of gas. It appears blind ending and is worrisome for an abscess. It is difficult to differentiate this from a bowel loop without oral contrast. Vascular/Lymphatic: No evidence of aortic aneurysm. No abnormal retroperitoneal adenopathy. The right ovarian vein is distended. 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 Reproductive: Uterus is within normal limits. Other: There is free fluid layering in the pelvis surrounding the distal sigmoid and rectum. Musculoskeletal: Thoracolumbar fusion hardware is in place. Severe scoliosis is present. IMPRESSION: The above findings are worrisome for acute appendicitis and adjacent abscess formation. The suspected distended appendix containing phleboliths measures up to 13 mm in caliber. The suspected abscess measures up to 4.6 cm in diameter. There is no oral contrast therefore it is difficult to differentiate the dilated appendix and adjacent suspected abscess with normal fluid-filled bowel loops. Other words, these abnormalities may simply represent fluid-filled bowel loops. The above findings are therefore indeterminate. Confirmation with a repeat study and oral contrast may be helpful. Critical Value/emergent results were called by telephone at the time of interpretation on 02/28/2018 at 9:20 pm to Dr. Elizabeth SauerJAIME WARD , who verbally acknowledged these results. Free-fluid in the pelvis. Prominent right ovarian vein leading to  pelvic varices. Scoliosis and postoperative changes in the lumbar spine. Indeterminate subcentimeter liver hypodensities. If  there is risk or history of malignancy, consider follow-up MRI with contrast. Electronically Signed   By: Jolaine Click M.D.   On: 02/28/2018 21:26    Anti-infectives: Anti-infectives (From admission, onward)   Start     Dose/Rate Route Frequency Ordered Stop   03/01/18 0630  piperacillin-tazobactam (ZOSYN) IVPB 3.375 g     3.375 g 12.5 mL/hr over 240 Minutes Intravenous Every 8 hours 03/01/18 0619     02/28/18 2200  piperacillin-tazobactam (ZOSYN) IVPB 3.375 g     3.375 g 100 mL/hr over 30 Minutes Intravenous  Once 02/28/18 2154 02/28/18 2347      Assessment/Plan:  Perforated appendicitis with abscesses  Appreciate IR's help.  Hopefully will improve with just the one drain.  Continuing IV antibiotics. Advance po   LOS: 1 day    Duff Pozzi A 03/02/2018

## 2018-03-02 NOTE — Progress Notes (Addendum)
Pt requested we only empty and recharge her drain twice per shift.Pt said she didn't want tylenol at this time.

## 2018-03-03 LAB — CBC
HCT: 29.5 % — ABNORMAL LOW (ref 36.0–46.0)
Hemoglobin: 9.9 g/dL — ABNORMAL LOW (ref 12.0–15.0)
MCH: 29.9 pg (ref 26.0–34.0)
MCHC: 33.6 g/dL (ref 30.0–36.0)
MCV: 89.1 fL (ref 78.0–100.0)
PLATELETS: 200 10*3/uL (ref 150–400)
RBC: 3.31 MIL/uL — ABNORMAL LOW (ref 3.87–5.11)
RDW: 13 % (ref 11.5–15.5)
WBC: 6.7 10*3/uL (ref 4.0–10.5)

## 2018-03-03 MED ORDER — IBUPROFEN 600 MG PO TABS
600.0000 mg | ORAL_TABLET | Freq: Four times a day (QID) | ORAL | Status: DC | PRN
  Administered 2018-03-03 – 2018-03-10 (×15): 600 mg via ORAL
  Filled 2018-03-03 (×16): qty 1

## 2018-03-03 MED ORDER — ACETAMINOPHEN 500 MG PO TABS
1000.0000 mg | ORAL_TABLET | Freq: Four times a day (QID) | ORAL | Status: DC | PRN
  Administered 2018-03-03 – 2018-03-05 (×5): 1000 mg via ORAL
  Filled 2018-03-03 (×5): qty 2

## 2018-03-03 NOTE — Progress Notes (Signed)
Subjective/Chief Complaint: Feels better Tolerated fulls   Objective: Vital signs in last 24 hours: Temp:  [98.1 F (36.7 C)] 98.1 F (36.7 C) (09/14 0521) Pulse Rate:  [83-89] 84 (09/14 0521) Resp:  [17-18] 17 (09/14 0521) BP: (93-109)/(65-75) 109/73 (09/14 0521) SpO2:  [99 %-100 %] 99 % (09/14 0521) Last BM Date: 03/02/18  Intake/Output from previous day: 09/13 0701 - 09/14 0700 In: 3786.4 [P.O.:480; I.V.:3031.3; IV Piggyback:150] Out: 285 [Emesis/NG output:180; Drains:105] Intake/Output this shift: Total I/O In: 565.8 [I.V.:542.9; IV Piggyback:23] Out: -   Exam: Looks comfortable Minimal abdominal tenderness Drain seropurulent  Lab Results:  Recent Labs    03/02/18 0920 03/03/18 0525  WBC 7.5 6.7  HGB 10.6* 9.9*  HCT 31.4* 29.5*  PLT 209 200   BMET Recent Labs    02/28/18 1435 03/01/18 0637  NA 136 135  K 3.5 3.5  CL 105 107  CO2 20* 18*  GLUCOSE 118* 84  BUN 9 <5*  CREATININE 0.81 0.83  CALCIUM 8.6* 8.0*   PT/INR Recent Labs    03/01/18 1023  LABPROT 15.1  INR 1.20   ABG No results for input(s): PHART, HCO3 in the last 72 hours.  Invalid input(s): PCO2, PO2  Studies/Results: Ct Image Guided Fluid Drain By Catheter  Result Date: 03/02/2018 INDICATION: 32 year old female with ruptured appendicitis. The presumed abscess adjacent to the ruptured appendix is non accessible via percutaneous method. She has been referred for drainage of pelvic fluid collection with trans gluteal drain. EXAM: CT-GUIDED DRAINAGE MEDICATIONS: The patient is currently admitted to the hospital and receiving intravenous antibiotics. The antibiotics were administered within an appropriate time frame prior to the initiation of the procedure. ANESTHESIA/SEDATION: Fentanyl 100 mcg IV; Versed 2.0 mg IV Moderate Sedation Time:  34 minutes The patient was continuously monitored during the procedure by the interventional radiology nurse under my direct supervision.  COMPLICATIONS: None PROCEDURE: Informed written consent was obtained from the patient after a thorough discussion of the procedural risks, benefits and alternatives. All questions were addressed. Maximal Sterile Barrier Technique was utilized including caps, mask, sterile gowns, sterile gloves, sterile drape, hand hygiene and skin antiseptic. A timeout was performed prior to the initiation of the procedure. Patient position prone on the CT table. Images of the pelvis were performed with images stored and sent to PACs. Once the patient is prepped and draped in usual sterile fashion. 1% lidocaine was used for local anesthesia. Small stab incision was made. Trocar needle was placed in the pelvic fluid collection. Using modified Seldinger technique, 10 French drain was placed into the pelvic fluid collection. Aspirate was achieved with approximately 40 cc of serosanguineous fluid. Sample sent for culture. Patient tolerated the procedure well and remained hemodynamically stable throughout. No complications were encountered and no significant blood loss. IMPRESSION: CT-guided placement of left trans gluteal drain for pelvic fluid collection. Sample was sent for a culture. Signed, Yvone NeuJaime S. Reyne DumasWagner, DO, RPVI [facility] Electronically Signed   By: Gilmer MorJaime  Wagner D.O.   On: 03/02/2018 08:43    Anti-infectives: Anti-infectives (From admission, onward)   Start     Dose/Rate Route Frequency Ordered Stop   03/01/18 0630  piperacillin-tazobactam (ZOSYN) IVPB 3.375 g     3.375 g 12.5 mL/hr over 240 Minutes Intravenous Every 8 hours 03/01/18 0619     02/28/18 2200  piperacillin-tazobactam (ZOSYN) IVPB 3.375 g     3.375 g 100 mL/hr over 30 Minutes Intravenous  Once 02/28/18 2154 02/28/18 2347  Assessment/Plan:  Perforated appendicitis with intra-abdominal abscess s/p IR placed drain  Still with moderate drain out put.  WBC normal. Will advance  po Continue IV antibiotics Repeat CT scan Monday   LOS: 2 days    Lindsey Tucker A 03/03/2018

## 2018-03-03 NOTE — Progress Notes (Signed)
MD notified of patient wondering why her motrin and her tylenol was taken away. She does not want to take strong medicine each time. MD stated she can have the medication back, I will put it as PRN.

## 2018-03-03 NOTE — Progress Notes (Signed)
Referring Physician(s): Abigail Miyamoto  Supervising Physician: Simonne Come  Patient Status:  [facility] - In-pt  Chief Complaint: None  Subjective:  Intraabdominal fluid collection s/p left TG drain placement 03/01/2018 with Dr. Loreta Ave. Patient awake and alert sitting in bed with no complaints at this time. Left TG drain c/d/i.   Allergies: Morphine and related  Medications: Prior to Admission medications   Medication Sig Start Date End Date Taking? Authorizing Provider  loratadine (CLARITIN) 10 MG tablet Take 10 mg by mouth daily as needed for allergies.    Yes [provider]  naproxen sodium (ALEVE) 220 MG tablet Take 220 mg by mouth 2 (two) times daily as needed (pain).   Yes [provider]  ibuprofen (ADVIL,MOTRIN) 800 MG tablet Take 1 tablet (800 mg total) by mouth every 8 (eight) hours as needed for mild pain, moderate pain or cramping. Patient not taking: Reported on 02/28/2018 02/04/16   [name], CNM     Vital Signs: BP 112/83 (BP Location: Left Arm)   Pulse 83   Temp 99.2 F (37.3 C) (Oral)   Resp 20   Ht 5' (1.524 m)   Wt 88 lb (39.9 kg)   LMP 01/28/2018   SpO2 99%   BMI 17.19 kg/m   Physical Exam  Constitutional: She is oriented to person, place, and time. She appears well-developed and well-nourished. No distress.  Pulmonary/Chest: Effort normal. No respiratory distress.  Abdominal:  Left TG drain site without erythema, drainage, or tenderness; approximately 110 mL of blood tinged fluid in JP drain; drain flushes/aspirates without resistance.  Neurological: She is alert and oriented to person, place, and time.  Skin: Skin is warm and dry.  Psychiatric: She has a normal mood and affect. Her behavior is normal. Judgment and thought content normal.  Nursing note and vitals reviewed.   Imaging: Ct Abdomen Pelvis W Contrast  Result Date: 02/28/2018 CLINICAL DATA:  Lower abdominal pain, nausea, vomiting, diarrhea, fever for 4  days EXAM: CT ABDOMEN AND PELVIS WITH CONTRAST TECHNIQUE: Multidetector CT imaging of the abdomen and pelvis was performed using the standard protocol following bolus administration of intravenous contrast. CONTRAST:  OMNIPAQUE IOHEXOL 300 MG/ML  SOLN COMPARISON:  None. FINDINGS: Lower chest: No acute abnormality. Hepatobiliary: Small subcentimeter left lobe hypodensities are nonspecific. Mild pericholecystic fluid. No gallstones. No obvious gallbladder wall thickening. Pancreas: Unremarkable Spleen: Unremarkable Adrenals/Urinary Tract: Kidneys and adrenal glands are unremarkable. Stomach/Bowel: There is no enteric contrast within bowel loops. Multiple fluid-filled small and large bowel loops are present in the abdomen and pelvis. This limits evaluation for abscess and perforation. There is a tubular structure containing gas and 2 calcific densities in the upper pelvis on image 51 through 60. It measures up to 13 mm in caliber and is worrisome for a distended appendix. There is a fluid and gas filled structure to the right of this tubular structure measuring 2.7 x 4.6 cm containing bubbles of gas. It appears blind ending and is worrisome for an abscess. It is difficult to differentiate this from a bowel loop without oral contrast. Vascular/Lymphatic: No evidence of aortic aneurysm. No abnormal retroperitoneal adenopathy. The right ovarian vein is distended. 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 Reproductive: Uterus is within normal limits. Other: There is free fluid layering in the pelvis surrounding the distal sigmoid and rectum. Musculoskeletal: Thoracolumbar fusion hardware is in place. Severe scoliosis is present. IMPRESSION: The above findings are worrisome for acute appendicitis and adjacent abscess formation. The suspected distended appendix  containing phleboliths measures up to 13 mm in caliber. The suspected abscess measures up to 4.6 cm in diameter. There is no oral contrast therefore it is  difficult to differentiate the dilated appendix and adjacent suspected abscess with normal fluid-filled bowel loops. Other words, these abnormalities may simply represent fluid-filled bowel loops. The above findings are therefore indeterminate. Confirmation with a repeat study and oral contrast may be helpful. Critical Value/emergent results were called by telephone at the time of interpretation on 02/28/2018 at 9:20 pm to Dr. Elizabeth Sauer , who verbally acknowledged these results. Free-fluid in the pelvis. 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 Scoliosis and postoperative changes in the lumbar spine. Indeterminate subcentimeter liver hypodensities. If there is risk or history of malignancy, consider follow-up MRI with contrast. Electronically Signed   By: Jolaine Click M.D.   On: 02/28/2018 21:26   Ct Image Guided Fluid Drain By Catheter  Result Date: 03/02/2018 INDICATION: 32 year old female with ruptured appendicitis. The presumed abscess adjacent to the ruptured appendix is non accessible via percutaneous method. She has been referred for drainage of pelvic fluid collection with trans gluteal drain. EXAM: CT-GUIDED DRAINAGE MEDICATIONS: The patient is currently admitted to the hospital and receiving intravenous antibiotics. The antibiotics were administered within an appropriate time frame prior to the initiation of the procedure. ANESTHESIA/SEDATION: Fentanyl 100 mcg IV; Versed 2.0 mg IV Moderate Sedation Time:  34 minutes The patient was continuously monitored during the procedure by the interventional radiology nurse under my direct supervision. COMPLICATIONS: None PROCEDURE: Informed written consent was obtained from the patient after a thorough discussion of the procedural risks, benefits and alternatives. All questions were addressed. Maximal Sterile Barrier Technique was utilized including caps, mask, sterile gowns, sterile gloves, sterile drape, hand hygiene and skin antiseptic. A  timeout was performed prior to the initiation of the procedure. Patient position prone on the CT table. Images of the pelvis were performed with images stored and sent to PACs. Once the patient is prepped and draped in usual sterile fashion. 1% lidocaine was used for local anesthesia. Small stab incision was made. Trocar needle was placed in the pelvic fluid collection. Using modified Seldinger technique, 10 French drain was placed into the pelvic fluid collection. Aspirate was achieved with approximately 40 cc of serosanguineous fluid. Sample sent for culture. Patient tolerated the procedure well and remained hemodynamically stable throughout. No complications were encountered and no significant blood loss. IMPRESSION: CT-guided placement of left trans gluteal drain for pelvic fluid collection. Sample was sent for a culture. Signed, Yvone Neu. Reyne Dumas, RPVI [facility] Electronically Signed   By: Gilmer Mor D.O.   On: 03/02/2018 08:43    Labs:  CBC: Recent Labs    02/28/18 1435 03/01/18 0637 03/02/18 0920 03/03/18 0525  WBC 16.9* 13.1* 7.5 6.7  HGB 12.5 10.7* 10.6* 9.9*  HCT 37.4 31.7* 31.4* 29.5*  PLT 289 215 209 200    COAGS: Recent Labs    03/01/18 1023  INR 1.20    BMP: Recent Labs    02/28/18 1435 03/01/18 0637  NA 136 135  K 3.5 3.5  CL 105 107  CO2 20* 18*  GLUCOSE 118* 84  BUN 9 <5*  CALCIUM 8.6* 8.0*  CREATININE 0.81 0.83  GFRNONAA >60 >60  GFRAA >60 >60    LIVER FUNCTION TESTS: Recent Labs    02/28/18 1435  BILITOT 0.7  AST 18  ALT 13  ALKPHOS 88  PROT 7.0  ALBUMIN 3.6    Assessment and Plan:  Intraabdominal fluid collection s/p left TG drain placement 03/01/2018 with Dr. Loreta AveWagner. Left TG drain stable with 110 mL of blood tinged fluid in JP drain. Continue with Qshift flushes and monitoring of output. Appreciate and agree with [facility] management. IR to follow.   Electronically  Signed: Elwin MochaAlexandra Javonta Gronau, PA-C 03/03/2018, 2:41 PM   I spent a total of 15 Minutes at the the patient's bedside AND on the patient's hospital floor or unit, greater than 50% of which was counseling/coordinating care for intraabdominal fluid collection s/p drain placement.

## 2018-03-04 NOTE — Progress Notes (Signed)
   Subjective/Chief Complaint: Reports minimal abdominal discomfort Having BM's Tolerating regular diet   Objective: Vital signs in last 24 hours: Temp:  [98.2 F (36.8 C)-99.2 F (37.3 C)] 98.6 F (37 C) (09/15 0501) Pulse Rate:  [71-83] 71 (09/15 0501) Resp:  [16-20] 16 (09/15 0501) BP: (103-112)/(74-83) 111/74 (09/15 0501) SpO2:  [99 %-100 %] 99 % (09/15 0501) Last BM Date: 03/03/18  Intake/Output from previous day: 09/14 0701 - 09/15 0700 In: 2221 [P.O.:360; I.V.:1665.8; IV Piggyback:100.1] Out: 2680 [Urine:2600; Drains:80] Intake/Output this shift: No intake/output data recorded.  Exam: Looks comfortable Abdomen soft, non-tender Drain serosang  Lab Results:  Recent Labs    03/02/18 0920 03/03/18 0525  WBC 7.5 6.7  HGB 10.6* 9.9*  HCT 31.4* 29.5*  PLT 209 200   BMET No results for input(s): NA, K, CL, CO2, GLUCOSE, BUN, CREATININE, CALCIUM in the last 72 hours. PT/INR Recent Labs    03/01/18 1023  LABPROT 15.1  INR 1.20   ABG No results for input(s): PHART, HCO3 in the last 72 hours.  Invalid input(s): PCO2, PO2  Studies/Results: No results found.  Anti-infectives: Anti-infectives (From admission, onward)   Start     Dose/Rate Route Frequency Ordered Stop   03/01/18 0630  piperacillin-tazobactam (ZOSYN) IVPB 3.375 g     3.375 g 12.5 mL/hr over 240 Minutes Intravenous Every 8 hours 03/01/18 0619     02/28/18 2200  piperacillin-tazobactam (ZOSYN) IVPB 3.375 g     3.375 g 100 mL/hr over 30 Minutes Intravenous  Once 02/28/18 2154 02/28/18 2347      Assessment/Plan:  Perforated appendicitis s/p IR placed drain  Clinically doing well.  Will repeat her CT tomorrow to re-evaluate her collection, especially the one around the appendix that IR could not drain  Continuing IV antibiotics  LOS: 3 days    Lindsey Tucker A 03/04/2018

## 2018-03-05 ENCOUNTER — Encounter (HOSPITAL_COMMUNITY): Payer: Self-pay

## 2018-03-05 ENCOUNTER — Inpatient Hospital Stay (HOSPITAL_COMMUNITY): Payer: 59

## 2018-03-05 LAB — C DIFFICILE QUICK SCREEN W PCR REFLEX
C DIFFICILE (CDIFF) TOXIN: NEGATIVE
C DIFFICLE (CDIFF) ANTIGEN: NEGATIVE
C Diff interpretation: NOT DETECTED

## 2018-03-05 MED ORDER — ENSURE ENLIVE PO LIQD
237.0000 mL | Freq: Two times a day (BID) | ORAL | Status: DC
  Administered 2018-03-06 – 2018-03-10 (×8): 237 mL via ORAL

## 2018-03-05 MED ORDER — CHLORHEXIDINE GLUCONATE CLOTH 2 % EX PADS
6.0000 | MEDICATED_PAD | Freq: Once | CUTANEOUS | Status: AC
  Administered 2018-03-06: 6 via TOPICAL

## 2018-03-05 MED ORDER — AMOXICILLIN-POT CLAVULANATE 875-125 MG PO TABS
1.0000 | ORAL_TABLET | Freq: Two times a day (BID) | ORAL | Status: DC
  Administered 2018-03-05 – 2018-03-09 (×9): 1 via ORAL
  Filled 2018-03-05 (×11): qty 1

## 2018-03-05 MED ORDER — GABAPENTIN 300 MG PO CAPS
300.0000 mg | ORAL_CAPSULE | ORAL | Status: AC
  Administered 2018-03-06: 300 mg via ORAL
  Filled 2018-03-05: qty 1

## 2018-03-05 MED ORDER — CEFAZOLIN SODIUM-DEXTROSE 2-4 GM/100ML-% IV SOLN
2.0000 g | INTRAVENOUS | Status: AC
  Administered 2018-03-06: 1 g via INTRAVENOUS

## 2018-03-05 MED ORDER — ACETAMINOPHEN 500 MG PO TABS
1000.0000 mg | ORAL_TABLET | ORAL | Status: AC
  Administered 2018-03-06: 1000 mg via ORAL
  Filled 2018-03-05: qty 2

## 2018-03-05 MED ORDER — CEFAZOLIN SODIUM-DEXTROSE 2-4 GM/100ML-% IV SOLN: 2.0000 g | INTRAVENOUS | Status: DC

## 2018-03-05 MED ORDER — CELECOXIB 200 MG PO CAPS
200.0000 mg | ORAL_CAPSULE | ORAL | Status: AC
  Administered 2018-03-06: 200 mg via ORAL
  Filled 2018-03-05: qty 1

## 2018-03-05 NOTE — Progress Notes (Signed)
Referring Physician(s): Abigail MiyamotoBlackman, Douglas  Supervising Physician: Richarda OverlieHenn, Adam  [name]  Chief Complaint: None Intraabdominal fluid collection s/p left TG drain placement 03/01/2018 with Dr. Loreta AveWagner.   Subjective: Sitting up in bed.  States she has little pain/cramping but feels distended with pelvic pressure.  Reports drain output as "bloody" yesterday.  Allergies: Morphine and related  Medications: Prior to Admission medications   Medication Sig Start Date End Date Taking? Authorizing Provider  loratadine (CLARITIN) 10 MG tablet Take 10 mg by mouth daily as needed for allergies.    Yes [provider]  naproxen sodium (ALEVE) 220 MG tablet Take 220 mg by mouth 2 (two) times daily as needed (pain).   Yes [provider]  ibuprofen (ADVIL,MOTRIN) 800 MG tablet Take 1 tablet (800 mg total) by mouth every 8 (eight) hours as needed for mild pain, moderate pain or cramping. Patient not taking: Reported on 02/28/2018 02/04/16   Raelyn Moraawson, Rolitta, CNM     Vital Signs: BP 111/74 (BP Location: Right Arm)   Pulse 66   Temp 98 F (36.7 C) (Oral)   Resp 17   Ht 5' (1.524 m)   Wt 88 lb (39.9 kg)   LMP 01/28/2018   SpO2 98%   BMI 17.19 kg/m   Physical Exam  NAD, alert.  Sitting up in bed.  Abdomen: Distended, overall non-tender.  Back/Flank:  TG drain in place with serous output. 170 mL output yesterday, 30 mL so far today.   Imaging: Ct Abdomen Pelvis Wo Contrast  Result Date: 03/05/2018 CLINICAL DATA:  Abscesses, appendicitis. EXAM: CT ABDOMEN AND PELVIS WITHOUT CONTRAST TECHNIQUE: Multidetector CT imaging of the abdomen and pelvis was performed following the standard protocol without IV contrast. COMPARISON:  02/28/2018. FINDINGS: Lower chest: Lung bases are clear. Heart size normal. No pericardial or pleural effusion. Hepatobiliary: Low-attenuation lesions in the liver measure up to 9 mm in the left hepatic lobe and are likely cysts. Liver  and gallbladder are otherwise unremarkable. No biliary ductal dilatation. Pancreas: Negative. Spleen: Negative. Adrenals/Urinary Tract: Adrenal glands and kidneys are unremarkable. Bladder is grossly unremarkable. Stomach/Bowel: Stomach, small bowel and colon are unremarkable. Vascular/Lymphatic: Vascular structures are unremarkable. No definite pathologically enlarged lymph nodes. Reproductive: Uterus is visualized.  No adnexal mass. Other: There is a large collection of fluid and air in the right paramidline anatomic pelvis, measuring 6.7 x 7.3 cm, previously 2.7 x 4.6 cm. A percutaneous drain terminates along the right posterolateral margin of the fluid collection. Small amount of stranding/fluid in the right paracolic gutter. Musculoskeletal: Scoliosis with Harrington rods in place. IMPRESSION: Enlarging right paramidline pelvic abscess. Percutaneous drain is seen along the right posterolateral margin, possibly residing outside the abscess. Electronically Signed   By: Leanna BattlesMelinda  Blietz M.D.   On: 03/05/2018 07:51   Ct Image Guided Fluid Drain By Catheter  Result Date: 03/02/2018 INDICATION: 32 year old female with ruptured appendicitis. The presumed abscess adjacent to the ruptured appendix is non accessible via percutaneous method. She has been referred for drainage of pelvic fluid collection with trans gluteal drain. EXAM: CT-GUIDED DRAINAGE MEDICATIONS: The patient is currently admitted to the hospital and receiving intravenous antibiotics. The antibiotics were administered within an appropriate time frame prior to the initiation of the procedure. ANESTHESIA/SEDATION: Fentanyl 100 mcg IV; Versed 2.0 mg IV Moderate Sedation Time:  34 minutes The patient was continuously monitored during the procedure by the interventional radiology nurse under my direct supervision. COMPLICATIONS: None PROCEDURE: Informed written consent was  obtained from the patient after a thorough discussion of the procedural risks,  benefits and alternatives. All questions were addressed. Maximal Sterile Barrier Technique was utilized including caps, mask, sterile gowns, sterile gloves, sterile drape, hand hygiene and skin antiseptic. A timeout was performed prior to the initiation of the procedure. Patient position prone on the CT table. Images of the pelvis were performed with images stored and sent to PACs. Once the patient is prepped and draped in usual sterile fashion. 1% lidocaine was used for local anesthesia. Small stab incision was made. Trocar needle was placed in the pelvic fluid collection. Using modified Seldinger technique, 10 French drain was placed into the pelvic fluid collection. Aspirate was achieved with approximately 40 cc of serosanguineous fluid. Sample sent for culture. Patient tolerated the procedure well and remained hemodynamically stable throughout. No complications were encountered and no significant blood loss. IMPRESSION: CT-guided placement of left trans gluteal drain for pelvic fluid collection. Sample was sent for a culture. Signed, Yvone Neu. Reyne Dumas, RPVI [facility] Electronically Signed   By: Gilmer Mor D.O.   On: 03/02/2018 08:43    Labs:  CBC: Recent Labs    02/28/18 1435 03/01/18 0637 03/02/18 0920 03/03/18 0525  WBC 16.9* 13.1* 7.5 6.7  HGB 12.5 10.7* 10.6* 9.9*  HCT 37.4 31.7* 31.4* 29.5*  PLT 289 215 209 200    COAGS: Recent Labs    03/01/18 1023  INR 1.20    BMP: Recent Labs    02/28/18 1435 03/01/18 0637  NA 136 135  K 3.5 3.5  CL 105 107  CO2 20* 18*  GLUCOSE 118* 84  BUN 9 <5*  CALCIUM 8.6* 8.0*  CREATININE 0.81 0.83  GFRNONAA >60 >60  GFRAA >60 >60    LIVER FUNCTION TESTS: Recent Labs    02/28/18 1435  BILITOT 0.7  AST 18  ALT 13  ALKPHOS 88  PROT 7.0  ALBUMIN 3.6    Assessment and Plan: Intraabdominal fluid collection s/p left TG drain placement 03/01/2018 with Dr. Loreta Ave. Left  TG drain remains intact.  Output appears more serous today that previously recorded.  CT Abdomen/Pelvis obtained this AM.   Discussed with Dr. Lowella Dandy who plans to discuss possibility of drain repositioning with surgical team. Collection adjacent to appendix remains unsafe/unaccessible from a percutaneous approach.  Difficult to distinguish whether collections are communicating. Remains afebrile with stable WBC.  Culture with rare GPC; still pending for 1 more day.   Electronically Signed: Hoyt Koch, PA 03/05/2018, 12:32 PM   I spent a total of 15 Minutes at the the patient's bedside AND on the patient's hospital floor or unit, greater than 50% of which was counseling/coordinating care for intraabdominal fluid collection.

## 2018-03-05 NOTE — Progress Notes (Signed)
Patient was educated about C-diff precaution.

## 2018-03-05 NOTE — H&P (View-Only) (Signed)
CT reviewed.  Fluid collection is larger and drained not communicating with that that is been placed by interventional radiology.  Options include laparotomy versus laparoscopic drainage of this large pelvic fluid collection.  The patient would like to try laparoscopic drainage of the pelvic fluid collection in the potentially interval appendectomy.  She would like to avoid laparotomy at all costs and I think by draining the abscess we could temporize this and then consider potentially interval appendectomy if necessary.  I explained it may not be necessary but we will not know that until later.  If possible the appendix could be removed tomorrow but I am not optimistic about that and the main goal is to drain the pelvic fluid collection.  She understands all the above limitations and agrees to proceed with laparoscopic pelvic abscess drainage in a.m.The procedure has been discussed with the patient.  Alternative therapies have been discussed with the patient.  Operative risks include bleeding,  Infection,  Organ injury,  Nerve injury,  Blood vessel injury,  DVT,  Pulmonary embolism,  Death,  And possible reoperation.  Medical management risks include worsening of present situation.  The success of the procedure is 50 -90 % at treating patients symptoms.  The patient understands and agrees to proceed. 

## 2018-03-05 NOTE — Progress Notes (Signed)
CT reviewed.  Fluid collection is larger and drained not communicating with that that is been placed by interventional radiology.  Options include laparotomy versus laparoscopic drainage of this large pelvic fluid collection.  The patient would like to try laparoscopic drainage of the pelvic fluid collection in the potentially interval appendectomy.  She would like to avoid laparotomy at all costs and I think by draining the abscess we could temporize this and then consider potentially interval appendectomy if necessary.  I explained it may not be necessary but we will not know that until later.  If possible the appendix could be removed tomorrow but I am not optimistic about that and the main goal is to drain the pelvic fluid collection.  She understands all the above limitations and agrees to proceed with laparoscopic pelvic abscess drainage in a.m.The procedure has been discussed with the patient.  Alternative therapies have been discussed with the patient.  Operative risks include bleeding,  Infection,  Organ injury,  Nerve injury,  Blood vessel injury,  DVT,  Pulmonary embolism,  Death,  And possible reoperation.  Medical management risks include worsening of present situation.  The success of the procedure is 50 -90 % at treating patients symptoms.  The patient understands and agrees to proceed.

## 2018-03-05 NOTE — Progress Notes (Signed)
Subjective/Chief Complaint: ABDOMINAL PAIN Pt  FEELS WELL VERY LITTLE ABDOMINAL PAIN  Tolerating diet  No fever or chills  CT shows drain in collection but larger  Drained 170 cc yesterday   Objective: Vital signs in last 24 hours: Temp:  [98 F (36.7 C)-99.1 F (37.3 C)] 98 F (36.7 C) (09/16 0506) Pulse Rate:  [62-80] 66 (09/16 0506) Resp:  [17-18] 17 (09/16 0506) BP: (102-114)/(74-85) 111/74 (09/16 0506) SpO2:  [98 %-100 %] 98 % (09/16 0506) Last BM Date: 03/03/18  Intake/Output from previous day: 09/15 0701 - 09/16 0700 In: 3799.9 [P.O.:620; I.V.:3014.9; IV Piggyback:150] Out: 1170 [Urine:1000; Drains:170] Intake/Output this shift: No intake/output data recorded.  General appearance: alert and cooperative Resp: clear to auscultation bilaterally GI: soft ND min RLQ tendernesstransgluteal drain in place   Serous in nature   Lab Results:  Recent Labs    03/02/18 0920 03/03/18 0525  WBC 7.5 6.7  HGB 10.6* 9.9*  HCT 31.4* 29.5*  PLT 209 200   BMET No results for input(s): NA, K, CL, CO2, GLUCOSE, BUN, CREATININE, CALCIUM in the last 72 hours. PT/INR No results for input(s): LABPROT, INR in the last 72 hours. ABG No results for input(s): PHART, HCO3 in the last 72 hours.  Invalid input(s): PCO2, PO2  Studies/Results: Ct Abdomen Pelvis Wo Contrast  Result Date: 03/05/2018 CLINICAL DATA:  Abscesses, appendicitis. EXAM: CT ABDOMEN AND PELVIS WITHOUT CONTRAST TECHNIQUE: Multidetector CT imaging of the abdomen and pelvis was performed following the standard protocol without IV contrast. COMPARISON:  02/28/2018. FINDINGS: Lower chest: Lung bases are clear. Heart size normal. No pericardial or pleural effusion. Hepatobiliary: Low-attenuation lesions in the liver measure up to 9 mm in the left hepatic lobe and are likely cysts. Liver and gallbladder are otherwise unremarkable. No biliary ductal dilatation. Pancreas: Negative. Spleen: Negative. Adrenals/Urinary Tract:  Adrenal glands and kidneys are unremarkable. Bladder is grossly unremarkable. Stomach/Bowel: Stomach, small bowel and colon are unremarkable. Vascular/Lymphatic: Vascular structures are unremarkable. No definite pathologically enlarged lymph nodes. Reproductive: Uterus is visualized.  No adnexal mass. Other: There is a large collection of fluid and air in the right paramidline anatomic pelvis, measuring 6.7 x 7.3 cm, previously 2.7 x 4.6 cm. A percutaneous drain terminates along the right posterolateral margin of the fluid collection. Small amount of stranding/fluid in the right paracolic gutter. Musculoskeletal: Scoliosis with Harrington rods in place. IMPRESSION: Enlarging right paramidline pelvic abscess. Percutaneous drain is seen along the right posterolateral margin, possibly residing outside the abscess. Electronically Signed   By: Leanna BattlesMelinda  Blietz M.D.   On: 03/05/2018 07:51    Anti-infectives: Anti-infectives (From admission, onward)   Start     Dose/Rate Route Frequency Ordered Stop   03/05/18 1000  amoxicillin-clavulanate (AUGMENTIN) 875-125 MG per tablet 1 tablet     1 tablet Oral Every 12 hours 03/05/18 0837     03/01/18 0630  piperacillin-tazobactam (ZOSYN) IVPB 3.375 g  Status:  Discontinued     3.375 g 12.5 mL/hr over 240 Minutes Intravenous Every 8 hours 03/01/18 0619 03/05/18 0837   02/28/18 2200  piperacillin-tazobactam (ZOSYN) IVPB 3.375 g     3.375 g 100 mL/hr over 30 Minutes Intravenous  Once 02/28/18 2154 02/28/18 2347      Assessment/Plan: Perforated appendicitis  S/p IR drain   Drain output high  CT shows increasing size of fluid collection?  Drain appears to be in place but may need reposition vs second drain IR following  Clinically doing well but having diarrhea  Check c diff Nl WBC and exam is benign at this point Change to PO ABX  Ask IR  Opinion on drain    LOS: 4 days    Clovis Pu Eion Timbrook 03/05/2018

## 2018-03-06 ENCOUNTER — Inpatient Hospital Stay (HOSPITAL_COMMUNITY): Payer: 59 | Admitting: Anesthesiology

## 2018-03-06 ENCOUNTER — Encounter (HOSPITAL_COMMUNITY): Admission: EM | Disposition: A | Discharge status: Home / Self Care | Payer: Self-pay | Source: Home / Self Care

## 2018-03-06 ENCOUNTER — Encounter (HOSPITAL_COMMUNITY): Payer: Self-pay | Admitting: Anesthesiology

## 2018-03-06 HISTORY — PX: LAPAROSCOPIC ABDOMINAL EXPLORATION: SHX6249

## 2018-03-06 LAB — AEROBIC/ANAEROBIC CULTURE W GRAM STAIN (SURGICAL/DEEP WOUND)

## 2018-03-06 LAB — BASIC METABOLIC PANEL
Anion gap: 10 (ref 5–15)
BUN: <5 mg/dL — ABNORMAL LOW (ref 6–20)
CO2: 26 mmol/L (ref 22–32)
CREATININE: 0.63 mg/dL (ref 0.44–1.00)
Calcium: 8.3 mg/dL — ABNORMAL LOW (ref 8.9–10.3)
Chloride: 107 mmol/L (ref 98–111)
GFR calc Af Amer: >60 mL/min (ref >60)
GLUCOSE: 111 mg/dL — AB (ref 70–99)
Potassium: 3.2 mmol/L — ABNORMAL LOW (ref 3.5–5.1)
SODIUM: 143 mmol/L (ref 135–145)

## 2018-03-06 LAB — CBC
HCT: 31.6 % — ABNORMAL LOW (ref 36.0–46.0)
Hemoglobin: 10.6 g/dL — ABNORMAL LOW (ref 12.0–15.0)
MCH: 29.9 pg (ref 26.0–34.0)
MCHC: 33.5 g/dL (ref 30.0–36.0)
MCV: 89 fL (ref 78.0–100.0)
PLATELETS: 323 10*3/uL (ref 150–400)
RBC: 3.55 MIL/uL — ABNORMAL LOW (ref 3.87–5.11)
RDW: 13.2 % (ref 11.5–15.5)
WBC: 11.3 10*3/uL — ABNORMAL HIGH (ref 4.0–10.5)

## 2018-03-06 LAB — AEROBIC/ANAEROBIC CULTURE (SURGICAL/DEEP WOUND): CULTURE: NO GROWTH

## 2018-03-06 SURGERY — EXPLORATION, ABDOMEN, LAPAROSCOPIC
Anesthesia: General | Site: Abdomen

## 2018-03-06 MED ORDER — FENTANYL CITRATE (PF) 100 MCG/2ML IJ SOLN: 25.0000 ug | INTRAMUSCULAR | Status: DC | PRN

## 2018-03-06 MED ORDER — SCOPOLAMINE 1 MG/3DAYS TD PT72
MEDICATED_PATCH | TRANSDERMAL | Status: AC
  Administered 2018-03-06: 1.5 mg via TRANSDERMAL
  Filled 2018-03-06: qty 1

## 2018-03-06 MED ORDER — FENTANYL CITRATE (PF) 250 MCG/5ML IJ SOLN
INTRAMUSCULAR | Status: AC
  Filled 2018-03-06: qty 5

## 2018-03-06 MED ORDER — DEXAMETHASONE SODIUM PHOSPHATE 10 MG/ML IJ SOLN
INTRAMUSCULAR | Status: AC
  Filled 2018-03-06: qty 1

## 2018-03-06 MED ORDER — OXYCODONE-ACETAMINOPHEN 5-325 MG PO TABS
1.0000 | ORAL_TABLET | ORAL | Status: DC | PRN
  Administered 2018-03-06 – 2018-03-07 (×4): 1 via ORAL
  Filled 2018-03-06 (×4): qty 1

## 2018-03-06 MED ORDER — MIDAZOLAM HCL 2 MG/2ML IJ SOLN
INTRAMUSCULAR | Status: AC
  Filled 2018-03-06: qty 2

## 2018-03-06 MED ORDER — SUCCINYLCHOLINE CHLORIDE 200 MG/10ML IV SOSY
PREFILLED_SYRINGE | INTRAVENOUS | Status: AC
  Filled 2018-03-06: qty 10

## 2018-03-06 MED ORDER — MIDAZOLAM HCL 5 MG/5ML IJ SOLN
INTRAMUSCULAR | Status: DC | PRN
  Administered 2018-03-06: 2 mg via INTRAVENOUS

## 2018-03-06 MED ORDER — LIDOCAINE 2% (20 MG/ML) 5 ML SYRINGE
INTRAMUSCULAR | Status: AC
  Filled 2018-03-06: qty 5

## 2018-03-06 MED ORDER — ROCURONIUM BROMIDE 50 MG/5ML IV SOSY
PREFILLED_SYRINGE | INTRAVENOUS | Status: AC
  Filled 2018-03-06: qty 5

## 2018-03-06 MED ORDER — SODIUM CHLORIDE 0.9 % IR SOLN
Status: DC | PRN
  Administered 2018-03-06: 1000 mL

## 2018-03-06 MED ORDER — PROMETHAZINE HCL 25 MG/ML IJ SOLN
6.2500 mg | INTRAMUSCULAR | Status: DC | PRN
  Administered 2018-03-06: 6.25 mg via INTRAVENOUS

## 2018-03-06 MED ORDER — LIDOCAINE 2% (20 MG/ML) 5 ML SYRINGE
INTRAMUSCULAR | Status: DC | PRN
  Administered 2018-03-06: 80 mg via INTRAVENOUS

## 2018-03-06 MED ORDER — SCOPOLAMINE 1 MG/3DAYS TD PT72
1.0000 | MEDICATED_PATCH | TRANSDERMAL | Status: DC
  Administered 2018-03-06: 1.5 mg via TRANSDERMAL

## 2018-03-06 MED ORDER — PROPOFOL 10 MG/ML IV BOLUS
INTRAVENOUS | Status: DC | PRN
  Administered 2018-03-06: 100 mg via INTRAVENOUS
  Administered 2018-03-06: 50 mg via INTRAVENOUS

## 2018-03-06 MED ORDER — BUPIVACAINE-EPINEPHRINE (PF) 0.25% -1:200000 IJ SOLN
INTRAMUSCULAR | Status: AC
  Filled 2018-03-06: qty 30

## 2018-03-06 MED ORDER — FENTANYL CITRATE (PF) 100 MCG/2ML IJ SOLN
INTRAMUSCULAR | Status: DC | PRN
  Administered 2018-03-06: 100 ug via INTRAVENOUS
  Administered 2018-03-06: 50 ug via INTRAVENOUS

## 2018-03-06 MED ORDER — BUPIVACAINE-EPINEPHRINE 0.25% -1:200000 IJ SOLN
INTRAMUSCULAR | Status: DC | PRN
  Administered 2018-03-06: 5 mL

## 2018-03-06 MED ORDER — SUGAMMADEX SODIUM 200 MG/2ML IV SOLN
INTRAVENOUS | Status: DC | PRN
  Administered 2018-03-06: 200 mg via INTRAVENOUS

## 2018-03-06 MED ORDER — DEXAMETHASONE SODIUM PHOSPHATE 4 MG/ML IJ SOLN
INTRAMUSCULAR | Status: DC | PRN
  Administered 2018-03-06: 10 mg via INTRAVENOUS

## 2018-03-06 MED ORDER — 0.9 % SODIUM CHLORIDE (POUR BTL) OPTIME
TOPICAL | Status: DC | PRN
  Administered 2018-03-06: 1000 mL

## 2018-03-06 MED ORDER — ONDANSETRON HCL 4 MG/2ML IJ SOLN
INTRAMUSCULAR | Status: DC | PRN
  Administered 2018-03-06: 4 mg via INTRAVENOUS

## 2018-03-06 MED ORDER — HYDROMORPHONE HCL 1 MG/ML IJ SOLN
INTRAMUSCULAR | Status: AC
  Filled 2018-03-06: qty 1

## 2018-03-06 MED ORDER — PROMETHAZINE HCL 25 MG/ML IJ SOLN
INTRAMUSCULAR | Status: AC
  Filled 2018-03-06: qty 1

## 2018-03-06 MED ORDER — HYDROMORPHONE HCL 1 MG/ML IJ SOLN
0.2500 mg | INTRAMUSCULAR | Status: DC | PRN
  Administered 2018-03-06 (×2): 0.5 mg via INTRAVENOUS

## 2018-03-06 MED ORDER — SUCCINYLCHOLINE CHLORIDE 20 MG/ML IJ SOLN
INTRAMUSCULAR | Status: DC | PRN
  Administered 2018-03-06: 120 mg via INTRAVENOUS

## 2018-03-06 MED ORDER — ONDANSETRON HCL 4 MG/2ML IJ SOLN
INTRAMUSCULAR | Status: AC
  Filled 2018-03-06: qty 2

## 2018-03-06 SURGICAL SUPPLY — 35 items
BIOPATCH RED 1 DISK 7.0 (GAUZE/BANDAGES/DRESSINGS) ×1 IMPLANT
BIOPATCH RED 1IN DISK 7.0MM (GAUZE/BANDAGES/DRESSINGS) ×1
BLADE CLIPPER SURG (BLADE) IMPLANT
CANISTER SUCT 3000ML PPV (MISCELLANEOUS) IMPLANT
CHLORAPREP W/TINT 26ML (MISCELLANEOUS) ×3 IMPLANT
COVER SURGICAL LIGHT HANDLE (MISCELLANEOUS) ×3 IMPLANT
DERMABOND ADVANCED (GAUZE/BANDAGES/DRESSINGS) ×2
DERMABOND ADVANCED .7 DNX12 (GAUZE/BANDAGES/DRESSINGS) ×1 IMPLANT
DRAIN CHANNEL 19F RND (DRAIN) ×2 IMPLANT
DRSG TEGADERM 2-3/8X2-3/4 SM (GAUZE/BANDAGES/DRESSINGS) ×2 IMPLANT
ELECT REM PT RETURN 9FT ADLT (ELECTROSURGICAL) ×3
ELECTRODE REM PT RTRN 9FT ADLT (ELECTROSURGICAL) ×1 IMPLANT
EVACUATOR SILICONE 100CC (DRAIN) ×2 IMPLANT
GLOVE BIO SURGEON STRL SZ8 (GLOVE) ×3 IMPLANT
GLOVE BIOGEL PI IND STRL 8 (GLOVE) ×1 IMPLANT
GLOVE BIOGEL PI INDICATOR 8 (GLOVE) ×2
GOWN STRL REUS W/ TWL LRG LVL3 (GOWN DISPOSABLE) ×2 IMPLANT
GOWN STRL REUS W/ TWL XL LVL3 (GOWN DISPOSABLE) ×1 IMPLANT
GOWN STRL REUS W/TWL LRG LVL3 (GOWN DISPOSABLE) ×4
GOWN STRL REUS W/TWL XL LVL3 (GOWN DISPOSABLE) ×2
KIT BASIN OR (CUSTOM PROCEDURE TRAY) ×3 IMPLANT
KIT TURNOVER KIT B (KITS) ×4 IMPLANT
NS IRRIG 1000ML POUR BTL (IV SOLUTION) ×3 IMPLANT
PAD ARMBOARD 7.5X6 YLW CONV (MISCELLANEOUS) ×3 IMPLANT
SCISSORS LAP 5X35 DISP (ENDOMECHANICALS) IMPLANT
SET IRRIG TUBING LAPAROSCOPIC (IRRIGATION / IRRIGATOR) ×2 IMPLANT
SHEARS HARMONIC ACE PLUS 36CM (ENDOMECHANICALS) IMPLANT
SLEEVE ENDOPATH XCEL 5M (ENDOMECHANICALS) ×3 IMPLANT
SUT ETHILON 2 0 FS 18 (SUTURE) ×2 IMPLANT
SUT MNCRL AB 4-0 PS2 18 (SUTURE) ×3 IMPLANT
TRAY LAPAROSCOPIC MC (CUSTOM PROCEDURE TRAY) ×3 IMPLANT
TROCAR XCEL BLUNT TIP 100MML (ENDOMECHANICALS) ×2 IMPLANT
TROCAR XCEL NON-BLD 11X100MML (ENDOMECHANICALS) IMPLANT
TROCAR XCEL NON-BLD 5MMX100MML (ENDOMECHANICALS) ×3 IMPLANT
TUBING INSUFFLATION (TUBING) ×3 IMPLANT

## 2018-03-06 NOTE — Progress Notes (Signed)
Report called to RN on 6N

## 2018-03-06 NOTE — Anesthesia Preprocedure Evaluation (Addendum)
Anesthesia Evaluation  Patient identified by MRN, date of birth, ID band Patient awake    Reviewed: Allergy & Precautions, NPO status , Patient's Chart, lab work & pertinent test results  History of Anesthesia Complications Negative for: history of anesthetic complications  Airway Mallampati: II  TM Distance: >3 FB Neck ROM: Full    Dental no notable dental hx. (+) Dental Advisory Given   Pulmonary asthma ,    Pulmonary exam normal        Cardiovascular negative cardio ROS Normal cardiovascular exam     Neuro/Psych negative neurological ROS     GI/Hepatic Neg liver ROS, GERD  ,  Endo/Other  negative endocrine ROS  Renal/GU negative Renal ROS     Musculoskeletal negative musculoskeletal ROS (+)   Abdominal   Peds  Hematology negative hematology ROS (+)   Anesthesia Other Findings Day of surgery medications reviewed with the patient.  Reproductive/Obstetrics                            Anesthesia Physical Anesthesia Plan  ASA: II  Anesthesia Plan: General   Post-op Pain Management:    Induction: Intravenous  PONV Risk Score and Plan: 4 or greater and Ondansetron, Dexamethasone, Scopolamine patch - Pre-op and Diphenhydramine  Airway Management Planned: Oral ETT  Additional Equipment:   Intra-op Plan:   Post-operative Plan: Extubation in OR  Informed Consent: I have reviewed the patients History and Physical, chart, labs and discussed the procedure including the risks, benefits and alternatives for the proposed anesthesia with the patient or authorized representative who has indicated his/her understanding and acceptance.   Dental advisory given  Plan Discussed with: CRNA, Anesthesiologist and Surgeon  Anesthesia Plan Comments:        Anesthesia Quick Evaluation

## 2018-03-06 NOTE — Transfer of Care (Signed)
Immediate Anesthesia Transfer of Care Note  Patient: Lindsey Tucker  Procedure(s) Performed: LAPAROSCOPIC PELVIC ABSCESS DRAINAGE (N/A Abdomen)  Patient Location: PACU  Anesthesia Type:General  Level of Consciousness: awake  Airway & Oxygen Therapy: Patient Spontanous Breathing and Patient connected to face mask oxygen  Post-op Assessment: Report given to RN and Post -op Vital signs reviewed and stable  Post vital signs: Reviewed and stable  Last Vitals:  Vitals Value Taken Time  BP 114/74 03/06/2018 10:28 AM  Temp    Pulse 102 03/06/2018 10:29 AM  Resp 20 03/06/2018 10:29 AM  SpO2 100 % 03/06/2018 10:29 AM  Vitals shown include unvalidated device data.  Last Pain:  Vitals:   03/06/18 0537  TempSrc: Oral  PainSc:       Patients Stated Pain Goal: 2 (03/05/18 2308)  Complications: No apparent anesthesia complications

## 2018-03-06 NOTE — Anesthesia Procedure Notes (Signed)
Procedure Name: Intubation Date/Time: 03/06/2018 9:38 AM Performed by: Caren Macadamarter, Maryann Mccall W, CRNA Pre-anesthesia Checklist: Patient identified, Emergency Drugs available, Suction available and Patient being monitored Patient Re-evaluated:Patient Re-evaluated prior to induction Oxygen Delivery Method: Circle system utilized Preoxygenation: Pre-oxygenation with 100% oxygen Induction Type: IV induction Ventilation: Mask ventilation without difficulty Laryngoscope Size: Miller and 2 Grade View: Grade I Tube type: Oral Tube size: 7.0 mm Number of attempts: 1 Airway Equipment and Method: Stylet and Oral airway Placement Confirmation: ETT inserted through vocal cords under direct vision,  positive ETCO2 and breath sounds checked- equal and bilateral Secured at: 22 cm Tube secured with: Tape Dental Injury: Teeth and Oropharynx as per pre-operative assessment

## 2018-03-06 NOTE — Interval H&P Note (Signed)
History and Physical Interval Note:  03/06/2018 9:09 AM  Lindsey Tucker  has presented today for surgery, with the diagnosis of Pelvic Abscess  The various methods of treatment have been discussed with the patient and family. After consideration of risks, benefits and other options for treatment, the patient has consented to  Procedure(s): LAPAROSCOPIC PELVIC ABSCESS DRAINAGE (N/A) as a surgical intervention .  The patient's history has been reviewed, patient examined, no change in status, stable for surgery.  I have reviewed the patient's chart and labs.  Questions were answered to the patient's satisfaction.     Stanley Helmuth A Kinzleigh Kandler

## 2018-03-06 NOTE — Op Note (Addendum)
Preoperative diagnosis: Perforated appendicitis with large pelvic abscess  Postoperative diagnosis: Same  Procedure: Laparoscopic drainage of pelvic abscess  Surgeon: Erroll Luna, MD  Anesthesia: General with local  EBL: 20 cc  Specimen: None  Drains: 19 round drain to right pelvic abscess  IV fluids: Per anesthesia record  Indications for procedure: The patient presents for laparoscopic drainage of pelvic abscess.  She was admitted last week with perforated appendix and had a large pelvic and right lower quadrant abscess that was drained percutaneously.  On repeat CT scanning the abscess was larger.  This was not amendable to any further percutaneous intervention.  We discussed laparoscopy with drainage of this and possible appendectomy.  Explained the appendix when it ruptures may not be easily found him may not be removed at time of drainage.  This may not have to happen at any point time as well which I discussed with her I discussed recurrent appendicitis risk of roughly 1 out of 3 over 1 year..  She understood the above limitations and the rationale for doing surgery.  Risks were discussed with the patient as well as potential long-term expectations and the need for open surgery as well.The procedure has been discussed with the patient.  Alternative therapies have been discussed with the patient.  Operative risks include bleeding,  Infection,  Organ injury,  Nerve injury,  Blood vessel injury,  DVT,  Pulmonary embolism, BLADDER INJURY BOWEL INJURY   Death,  And possible reoperation.  Medical management risks include worsening of present situation.  The success of the procedure is 50 -90 % at treating patients symptoms.  The patient understands and agrees to proceed.    Description of procedure: The patient was met in the holding area and questions were answered.  She was taken back to the operative room.  She was placed supine upon the operating table.  After induction of general  anesthesia the abdomen was prepped and draped in sterile fashion her left arm was tucked.  Timeout was done and she was already on preoperative antibiotics.  Local anesthetic was infiltrated along the inferior border of the umbilicus.  A 1 cm incision was made and dissection was carried down to the fascia.  The fascia was opened midline and a pursestring suture of 0 Vicryl was placed in a 12 mm Hassan cannula was placed under direct vision.  Pneumoperitoneum was created to 15 mmHg CO2 pressure.  Laparoscope was placed.  The cecum was densely adherent down to the dome of the bladder in the right lower quadrant.  2 5 Miller ports were placed.  One in the upper midline one in the lower midline.  Very gently was able to mobilize the cecum carefully.  When I mobilized the cecum I was able to enter a large abscess in the right lower quadrant was full of pus.  This was copiously irrigated out with a liter saline.  The abscess was adjacent to her right fallopian tube and ovary was densely adherent to the bladder.  The appendix was obliterated off and not easily found.  There is no evidence of any leakage of stool or urine from this area.  The bladder wall appeared intact and had a moderate amount of urine in the bladder to help distended and define its border.  No evidence of bladder injury or leakage.   This cavity was irrigated.  The lower port site and placed in 19 round Blake drain into the cavity.  After irrigating out with copious amounts of saline  this was suctioned out.  Inspection of the cecum revealed no evidence of injury to the cecum bladder or other pelvic or bowel structure.  Four-quadrant laparoscopy was otherwise normal.  At this point time allowed the CO2 escape and removed my ports.  Port sites closed 4-0 Monocryl.  Dermabond applied.  Drain secured with 2-0 nylon and placed to bulb suction.  All final counts found to be correct.  The patient was awoke extubated taken to recovery in satisfactory  condition.

## 2018-03-06 NOTE — Progress Notes (Signed)
Patient off floor to OR. Report called to short stay.

## 2018-03-06 NOTE — Progress Notes (Signed)
Per Dr. Luisa Hartornett, okay to transfer pt from 5W to [facility]6N

## 2018-03-06 NOTE — Anesthesia Postprocedure Evaluation (Signed)
Anesthesia Post Note  Patient: Lindsey Tucker  Procedure(s) Performed: LAPAROSCOPIC PELVIC ABSCESS DRAINAGE (N/A Abdomen)     Patient location during evaluation: PACU Anesthesia Type: General Level of consciousness: sedated Pain management: pain level controlled Vital Signs Assessment: post-procedure vital signs reviewed and stable Respiratory status: spontaneous breathing and respiratory function stable Cardiovascular status: stable Postop Assessment: no apparent nausea or vomiting Anesthetic complications: no                  Alanah Sakuma DANIEL

## 2018-03-06 NOTE — Progress Notes (Signed)
Initial Nutrition Assessment  DOCUMENTATION CODES:   Underweight  INTERVENTION:   Continue as desired Ensure Enlive po BID, each supplement provides 350 kcal and 20 grams of protein  Encouraged pt to order [employer] Instant Breakfast and magic cup on meal trays as desired.  Discussed importance of adequate nutrition for recovery.    NUTRITION DIAGNOSIS:   Inadequate oral intake related to acute illness(perforated appendicitis ) as evidenced by meal completion < 50%.  GOAL:   Patient will meet greater than or equal to 90% of their needs  MONITOR:   PO intake, Supplement acceptance  REASON FOR ASSESSMENT:   Malnutrition Screening Tool    ASSESSMENT:   Pt with PMH of GERD admitted 9/11 with perforated appendicitis with periappendiceal abscess. Pt is now s/p L transgluteal drain placed by IR 9/12 and laparoscopic drainage of pelvic abscess in OR 9/17.    Pt with two drains, output recorded today as:  1: 73 ml  2: 137  Spoke with pt who reports that her usual body weight is 90 lb, she has only every been > 100 lb when she was pregnant. She developed nausea/vomiting, and diarrhea 9 days ago (PTA) which she thought was food poisoning but then later realized it was due to her perforated appendicitis. Since admission pt has been able to tolerate food but is only eating about a quarter of the meal. She is ordering mostly from [employer].  She has tried the ensure enlive and liked it. We also reviewed other oral nutrition supplement options that she may order if desired from the cafeteria such as [employer] Instant Breakfast and magic cup.    Medications reviewed  Labs reviewed   NUTRITION - FOCUSED PHYSICAL EXAM:    Most Recent Value  Orbital Region  No depletion  Upper Arm Region  No depletion  Thoracic and Lumbar Region  No depletion  Buccal Region  No depletion  Temple Region  No depletion  Clavicle Bone Region  No depletion  Clavicle and Acromion Bone Region  No depletion   Scapular Bone Region  No depletion  Dorsal Hand  No depletion  Patellar Region  No depletion  Anterior Thigh Region  No depletion  Posterior Calf Region  No depletion  Edema (RD Assessment)  None  Hair  Reviewed  Eyes  Reviewed  Mouth  Reviewed  Skin  Reviewed  Nails  Reviewed       Diet Order:   Diet Order            Diet regular Room service appropriate? Yes; Fluid consistency: Thin  Diet effective now              EDUCATION NEEDS:   Education needs have been addressed  Skin:  Skin Assessment: (abd incision)  Last BM:  9/16 - type 7  Height:   Ht Readings from Last 1 Encounters:  02/28/18 5' (1.524 m)    Weight:   Wt Readings from Last 1 Encounters:  02/28/18 39.9 kg    Ideal Body Weight:  45.4 kg  BMI:  Body mass index is 17.19 kg/m.  Estimated Nutritional Needs:   Kcal:  1300-1500  Protein:  57-65 grams  Fluid:  > 1.5 L/day  Kendell BaneHeather Corrie Reder RD, LDN, CNSC [phone] Pager [phone] After Hours Pager

## 2018-03-07 ENCOUNTER — Encounter (HOSPITAL_COMMUNITY): Payer: Self-pay | Admitting: Surgery

## 2018-03-07 LAB — CBC
HCT: 31.8 % — ABNORMAL LOW (ref 36.0–46.0)
Hemoglobin: 10.2 g/dL — ABNORMAL LOW (ref 12.0–15.0)
MCH: 29.4 pg (ref 26.0–34.0)
MCHC: 32.1 g/dL (ref 30.0–36.0)
MCV: 91.6 fL (ref 78.0–100.0)
PLATELETS: 405 10*3/uL — AB (ref 150–400)
RBC: 3.47 MIL/uL — ABNORMAL LOW (ref 3.87–5.11)
RDW: 13.7 % (ref 11.5–15.5)
WBC: 16.4 10*3/uL — AB (ref 4.0–10.5)

## 2018-03-07 LAB — BASIC METABOLIC PANEL
ANION GAP: 8 (ref 5–15)
BUN: 7 mg/dL (ref 6–20)
CALCIUM: 8.8 mg/dL — AB (ref 8.9–10.3)
CO2: 28 mmol/L (ref 22–32)
Chloride: 107 mmol/L (ref 98–111)
Creatinine, Ser: 0.67 mg/dL (ref 0.44–1.00)
Glucose, Bld: 126 mg/dL — ABNORMAL HIGH (ref 70–99)
Potassium: 3.3 mmol/L — ABNORMAL LOW (ref 3.5–5.1)
Sodium: 143 mmol/L (ref 135–145)

## 2018-03-07 MED ORDER — SACCHAROMYCES BOULARDII 250 MG PO CAPS
250.0000 mg | ORAL_CAPSULE | Freq: Two times a day (BID) | ORAL | Status: DC
  Administered 2018-03-07 – 2018-03-09 (×6): 250 mg via ORAL
  Filled 2018-03-07 (×7): qty 1

## 2018-03-07 MED ORDER — HYDROMORPHONE HCL 1 MG/ML IJ SOLN: 0.5000 mg | INTRAMUSCULAR | Status: DC | PRN

## 2018-03-07 MED ORDER — OXYCODONE-ACETAMINOPHEN 5-325 MG PO TABS
1.0000 | ORAL_TABLET | ORAL | Status: DC | PRN
  Administered 2018-03-07 – 2018-03-10 (×10): 1 via ORAL
  Filled 2018-03-07 (×11): qty 1

## 2018-03-07 MED ORDER — HEPARIN SODIUM (PORCINE) 5000 UNIT/ML IJ SOLN
5000.0000 [IU] | Freq: Three times a day (TID) | INTRAMUSCULAR | Status: DC
  Administered 2018-03-07 – 2018-03-10 (×9): 5000 [IU] via SUBCUTANEOUS
  Filled 2018-03-07 (×9): qty 1

## 2018-03-07 NOTE — Progress Notes (Addendum)
[facility] Progress Note  1 Day Post-Op  Subjective: CC-  Sitting up in chair, husband at bedside. States that she had some pain over night, feeling a little better this AM. Tolerated reg diet for dinner and Ensure for breakfast. Feels a little bloated, denies n/v. No flatus or BM since surgery. Ambulated 8 laps yesterday.  Objective: Vital signs in last 24 hours: Temp:  [97.1 F (36.2 C)-99 F (37.2 C)] 98.8 F (37.1 C) (09/18 0607) Pulse Rate:  [70-102] 70 (09/18 0607) Resp:  [13-20] 19 (09/18 0607) BP: (101-117)/(62-86) 104/62 (09/18 0607) SpO2:  [97 %-100 %] 100 % (09/18 0607) Last BM Date: 03/06/18  Intake/Output from previous day: 09/17 0701 - 09/18 0700 In: 2868.7 [P.O.:60; I.V.:2713.7] Out: 345 [Drains:335; Blood:10] Intake/Output this shift: No intake/output data recorded.  PE: Gen:  Alert, NAD, pleasant HEENT: EOM's intact, pupils equal and round Card:  RRR Pulm:  CTAB, no W/R/R, effort normal Abd: Soft, mild distension, few BS heard, mild lower abdominal TTP, JP drain SS, TG drain SS Psych: A&Ox3  Skin: no rashes noted, warm and dry  Lab Results:  Recent Labs    03/06/18 0535 03/07/18 0951  WBC 11.3* 16.4*  HGB 10.6* 10.2*  HCT 31.6* 31.8*  PLT 323 405*   BMET Recent Labs    03/06/18 0535  NA 143  K 3.2*  CL 107  CO2 26  GLUCOSE 111*  BUN <5*  CREATININE 0.63  CALCIUM 8.3*   PT/INR No results for input(s): LABPROT, INR in the last 72 hours. CMP     Component Value Date/Time   NA 143 03/06/2018 0535   K 3.2 (L) 03/06/2018 0535   CL 107 03/06/2018 0535   CO2 26 03/06/2018 0535   GLUCOSE 111 (H) 03/06/2018 0535   BUN <5 (L) 03/06/2018 0535   CREATININE 0.63 03/06/2018 0535   CALCIUM 8.3 (L) 03/06/2018 0535   PROT 7.0 02/28/2018 1435   ALBUMIN 3.6 02/28/2018 1435   AST 18 02/28/2018 1435   ALT 13 02/28/2018 1435   ALKPHOS 88 02/28/2018 1435   BILITOT 0.7 02/28/2018 1435   GFRNONAA >60 03/06/2018 0535   GFRAA >60  03/06/2018 0535   Lipase     Component Value Date/Time   LIPASE 26 02/28/2018 1435       Studies/Results: No results found.  Anti-infectives: Anti-infectives (From admission, onward)   Start     Dose/Rate Route Frequency Ordered Stop   03/06/18 1200  ceFAZolin (ANCEF) IVPB 2g/100 mL premix     2 g 200 mL/hr over 30 Minutes Intravenous On call to O.R. 03/05/18 1547 03/06/18 1147   03/06/18 1130  ceFAZolin (ANCEF) IVPB 2g/100 mL premix  Status:  Discontinued     2 g 200 mL/hr over 30 Minutes Intravenous On call to O.R. 03/05/18 2333 03/06/18 1144   03/05/18 1000  amoxicillin-clavulanate (AUGMENTIN) 875-125 MG per tablet 1 tablet     1 tablet Oral Every 12 hours 03/05/18 0837     03/01/18 0630  piperacillin-tazobactam (ZOSYN) IVPB 3.375 g  Status:  Discontinued     3.375 g 12.5 mL/hr over 240 Minutes Intravenous Every 8 hours 03/01/18 0619 03/05/18 0837   02/28/18 2200  piperacillin-tazobactam (ZOSYN) IVPB 3.375 g     3.375 g 100 mL/hr over 30 Minutes Intravenous  Once 02/28/18 2154 02/28/18 2347       Assessment/Plan GERD Asthma Scoliosis  Perforated appendicitis with large pelvic abscess S/p IR TG drain 9/12, cx with no growth -  drain with 98cc/24hr S/p laparoscopic drainage of pelvic abscess 9/17 Dr. Luisa Hartornett - POD 1 - JP drain with 237cc/24hr - WBC up to 16.4, may be reactive from surgery, repeat CBC in AM. Afebrile - will need repeat CT scan about 10 days postop  ID - zosyn 9/11>>9/16, augmentin 9/16>> FEN - decrease IVF 150mL/hr, reg diet, Ensure VTE - SCDs, sq heparin Foley - none Follow up - IR, Dr. Luisa Hartornett  Plan - BMP pending. Continue drains and abx. Encouraged ambulation today. Add probiotic. Repeat CBC in AM.    LOS: 6 days    Franne FortsBrooke A Meuth , [facility] 03/07/2018, 10:11 AM Pager: [phone] Consults: [phone] Mon 7:00 am -11:30 AM Tues-Fri 7:00 am-4:30 pm Sat-Sun 7:00 am-11:30 am

## 2018-03-07 NOTE — Plan of Care (Signed)
?  Problem: Activity: ?Goal: Risk for activity intolerance will decrease ?Outcome: Progressing ?  ?Problem: Nutrition: ?Goal: Adequate nutrition will be maintained ?Outcome: Progressing ?  ?Problem: Elimination: ?Goal: Will not experience complications related to urinary retention ?Outcome: Progressing ?  ?Problem: Pain Managment: ?Goal: General experience of comfort will improve ?Outcome: Progressing ?  ?

## 2018-03-08 LAB — CBC
HEMATOCRIT: 28.6 % — AB (ref 36.0–46.0)
Hemoglobin: 9.4 g/dL — ABNORMAL LOW (ref 12.0–15.0)
MCH: 29.8 pg (ref 26.0–34.0)
MCHC: 32.9 g/dL (ref 30.0–36.0)
MCV: 90.8 fL (ref 78.0–100.0)
Platelets: 371 10*3/uL (ref 150–400)
RBC: 3.15 MIL/uL — ABNORMAL LOW (ref 3.87–5.11)
RDW: 13.7 % (ref 11.5–15.5)
WBC: 17 10*3/uL — ABNORMAL HIGH (ref 4.0–10.5)

## 2018-03-08 MED ORDER — POLYETHYLENE GLYCOL 3350 17 G PO PACK
17.0000 g | PACK | Freq: Every day | ORAL | Status: DC
  Administered 2018-03-08 – 2018-03-09 (×2): 17 g via ORAL
  Filled 2018-03-08 (×3): qty 1

## 2018-03-08 MED ORDER — POTASSIUM CHLORIDE CRYS ER 20 MEQ PO TBCR
20.0000 meq | EXTENDED_RELEASE_TABLET | Freq: Two times a day (BID) | ORAL | Status: DC
  Administered 2018-03-08 – 2018-03-09 (×4): 20 meq via ORAL
  Filled 2018-03-08 (×5): qty 1

## 2018-03-08 MED ORDER — POTASSIUM CHLORIDE IN NACL 40-0.9 MEQ/L-% IV SOLN
INTRAVENOUS | Status: DC
  Filled 2018-03-08: qty 1000

## 2018-03-08 MED ORDER — DOCUSATE SODIUM 100 MG PO CAPS
100.0000 mg | ORAL_CAPSULE | Freq: Every day | ORAL | Status: DC
  Administered 2018-03-08 – 2018-03-09 (×2): 100 mg via ORAL
  Filled 2018-03-08 (×3): qty 1

## 2018-03-08 NOTE — Progress Notes (Signed)
[facility]/Trauma Progress Note  2 Days Post-Op   Assessment/Plan GERD Asthma Scoliosis  Perforated appendicitis with large pelvic abscess S/p IR TG drain 9/12, cx with no growth - drain with 65cc/24hr S/p laparoscopic drainage of pelvic abscess 9/17 Dr. Luisa Hartornett - JP drain with 15cc/24hr - WBC up to 17, may be reactive from surgery, repeat CBC in AM. Afebrile - will need repeat CT scan about 10 days postop Hypokalemia - PO replenishment, am labs  ID - zosyn 9/11>>9/16, augmentin 9/16>> FEN - reg diet, Ensure VTE - SCDs, sq heparin Foley - none Follow up - IR, Dr. Luisa Hartornett  Plan - Continue drains and abx. Encouraged ambulation today. Repeat CBC and BMP in AM.    LOS: 7 days    Subjective: CC: L sided abdominal pain  Pain improved from yesterday. Having flatus but no BM. Tolerating diet. No nausea, vomiting, fever or chills overnight. Tmax overnight 100.2. Husband at bedside. Answered questions regarding need for future surgery and changing antibiotics.   Objective: Vital signs in last 24 hours: Temp:  [98.2 F (36.8 C)-100.2 F (37.9 C)] 98.2 F (36.8 C) (09/19 0509) Pulse Rate:  [74-89] 74 (09/19 0509) Resp:  [16] 16 (09/19 0509) BP: (92-114)/(62-75) 106/62 (09/19 0509) SpO2:  [99 %-100 %] 100 % (09/19 0509) Last BM Date: 03/07/18  Intake/Output from previous day: 09/18 0701 - 09/19 0700 In: 505 [I.V.:505] Out: 80 [Drains:80] Intake/Output this shift: No intake/output data recorded.  PE: Gen:  Alert, NAD, pleasant, cooperative Card:  RRR, no M/G/R heard Pulm:  CTA, no W/R/R, rate and effort normal Abd: Soft, moderate distention, +BS, incisions with glue intact are well appearing, surgical drain with serosanguinous drainage, IR drain with cloudy serosanguinous drainage, moderate TTP of left hemiabdomen with guarding.  Skin: no rashes noted, warm and dry   Anti-infectives: Anti-infectives (From admission, onward)   Start     Dose/Rate  Route Frequency Ordered Stop   03/06/18 1200  ceFAZolin (ANCEF) IVPB 2g/100 mL premix     2 g 200 mL/hr over 30 Minutes Intravenous On call to O.R. 03/05/18 1547 03/06/18 1147   03/06/18 1130  ceFAZolin (ANCEF) IVPB 2g/100 mL premix  Status:  Discontinued     2 g 200 mL/hr over 30 Minutes Intravenous On call to O.R. 03/05/18 2333 03/06/18 1144   03/05/18 1000  amoxicillin-clavulanate (AUGMENTIN) 875-125 MG per tablet 1 tablet     1 tablet Oral Every 12 hours 03/05/18 0837     03/01/18 0630  piperacillin-tazobactam (ZOSYN) IVPB 3.375 g  Status:  Discontinued     3.375 g 12.5 mL/hr over 240 Minutes Intravenous Every 8 hours 03/01/18 0619 03/05/18 0837   02/28/18 2200  piperacillin-tazobactam (ZOSYN) IVPB 3.375 g     3.375 g 100 mL/hr over 30 Minutes Intravenous  Once 02/28/18 2154 02/28/18 2347      Lab Results:  Recent Labs    03/07/18 0951 03/08/18 0426  WBC 16.4* 17.0*  HGB 10.2* 9.4*  HCT 31.8* 28.6*  PLT 405* 371   BMET Recent Labs    03/06/18 0535 03/07/18 0951  NA 143 143  K 3.2* 3.3*  CL 107 107  CO2 26 28  GLUCOSE 111* 126*  BUN <5* 7  CREATININE 0.63 0.67  CALCIUM 8.3* 8.8*   PT/INR No results for input(s): LABPROT, INR in the last 72 hours. CMP     Component Value Date/Time   NA 143 03/07/2018 0951   K 3.3 (L) 03/07/2018 0951   CL  107 03/07/2018 0951   CO2 28 03/07/2018 0951   GLUCOSE 126 (H) 03/07/2018 0951   BUN 7 03/07/2018 0951   CREATININE 0.67 03/07/2018 0951   CALCIUM 8.8 (L) 03/07/2018 0951   PROT 7.0 02/28/2018 1435   ALBUMIN 3.6 02/28/2018 1435   AST 18 02/28/2018 1435   ALT 13 02/28/2018 1435   ALKPHOS 88 02/28/2018 1435   BILITOT 0.7 02/28/2018 1435   GFRNONAA >60 03/07/2018 0951   GFRAA >60 03/07/2018 0951   Lipase     Component Value Date/Time   LIPASE 26 02/28/2018 1435    Studies/Results: No results found.    Jerre Simon , [facility] 03/08/2018, 9:03 AM  Pager: [phone] Mon-Wed, Friday  7:00am-4:30pm Thurs 7am-11:30am  Consults: [phone]

## 2018-03-08 NOTE — Progress Notes (Signed)
Referring Physician(s): Dr Luisa Hartornett  Supervising Physician: Irish LackYamagata, Glenn  [facility]  [facility]  Chief Complaint:  TG drain placed in IR 9/12 perf appy  Subjective:  Doing well Post op 2 days -- laparoscopic drainage of pelvic abscess 9/17 Dr. Luisa Hartornett Up in chair Feels better daily Wbc to 17 today afeb IR TG drain intact   Allergies: Morphine and related  Medications: Prior to Admission medications   Medication Sig Start Date End Date Taking? Authorizing Provider  loratadine (CLARITIN) 10 MG tablet Take 10 mg by mouth daily as needed for allergies.    Yes [provider]  naproxen sodium (ALEVE) 220 MG tablet Take 220 mg by mouth 2 (two) times daily as needed (pain).   Yes [provider]  ibuprofen (ADVIL,MOTRIN) 800 MG tablet Take 1 tablet (800 mg total) by mouth every 8 (eight) hours as needed for mild pain, moderate pain or cramping. Patient not taking: Reported on 02/28/2018 02/04/16   Raelyn Moraawson, Rolitta, CNM     Vital Signs: BP 94/60 (BP Location: Left Arm)   Pulse (!) 101   Temp 98.2 F (36.8 C) (Oral)   Resp 16   Ht 5' (1.524 m)   Wt 88 lb (39.9 kg)   LMP 01/28/2018   SpO2 99%   BMI 17.19 kg/m   Physical Exam  Neurological: She is alert.  Skin: Skin is warm and dry.  Skin site is clean and dry NT no bleeding OP serous 65  cc yesterday; 40 cc today NG  Vitals reviewed.   Imaging: Ct Abdomen Pelvis Wo Contrast  Result Date: 03/05/2018 CLINICAL DATA:  Abscesses, appendicitis. EXAM: CT ABDOMEN AND PELVIS WITHOUT CONTRAST TECHNIQUE: Multidetector CT imaging of the abdomen and pelvis was performed following the standard protocol without IV contrast. COMPARISON:  02/28/2018. FINDINGS: Lower chest: Lung bases are clear. Heart size normal. No pericardial or pleural effusion. Hepatobiliary: Low-attenuation lesions in the liver measure up to 9 mm in the left hepatic lobe and are likely cysts. Liver and gallbladder are otherwise  unremarkable. No biliary ductal dilatation. Pancreas: Negative. Spleen: Negative. Adrenals/Urinary Tract: Adrenal glands and kidneys are unremarkable. Bladder is grossly unremarkable. Stomach/Bowel: Stomach, small bowel and colon are unremarkable. Vascular/Lymphatic: Vascular structures are unremarkable. No definite pathologically enlarged lymph nodes. Reproductive: Uterus is visualized.  No adnexal mass. Other: There is a large collection of fluid and air in the right paramidline anatomic pelvis, measuring 6.7 x 7.3 cm, previously 2.7 x 4.6 cm. A percutaneous drain terminates along the right posterolateral margin of the fluid collection. Small amount of stranding/fluid in the right paracolic gutter. Musculoskeletal: Scoliosis with Harrington rods in place. IMPRESSION: Enlarging right paramidline pelvic abscess. Percutaneous drain is seen along the right posterolateral margin, possibly residing outside the abscess. Electronically Signed   By: Leanna BattlesMelinda  Blietz M.D.   On: 03/05/2018 07:51    Labs:  CBC: Recent Labs    03/03/18 0525 03/06/18 0535 03/07/18 0951 03/08/18 0426  WBC 6.7 11.3* 16.4* 17.0*  HGB 9.9* 10.6* 10.2* 9.4*  HCT 29.5* 31.6* 31.8* 28.6*  PLT 200 323 405* 371    COAGS: Recent Labs    03/01/18 1023  INR 1.20    BMP: Recent Labs    02/28/18 1435 03/01/18 0637 03/06/18 0535 03/07/18 0951  NA 136 135 143 143  K 3.5 3.5 3.2* 3.3*  CL 105 107 107 107  CO2 20* 18* 26 28  GLUCOSE 118* 84 111* 126*  BUN 9 <5* <5* 7  CALCIUM 8.6* 8.0* 8.3* 8.8*  CREATININE 0.81 0.83 0.63 0.67  GFRNONAA >60 >60 >60 >60  GFRAA >60 >60 >60 >60    LIVER FUNCTION TESTS: Recent Labs    02/28/18 1435  BILITOT 0.7  AST 18  ALT 13  ALKPHOS 88  PROT 7.0  ALBUMIN 3.6    Assessment and Plan:  Perorated appendix TG abscess drain placed in IR 9/12 OP serous Will need to flush daily at home 10 cc sterile saline IR will follow as OP at IR Clinic She will hear from scheduler for time  and date Orders placed  Electronically Signed: Leonor Darnell A, PA-C 03/08/2018, 4:24 PM   I spent a total of 15 Minutes at the the patient's bedside AND on the patient's hospital floor or unit, greater than 50% of which was counseling/coordinating care for TG drain

## 2018-03-09 LAB — BASIC METABOLIC PANEL
Anion gap: 11 (ref 5–15)
BUN: 7 mg/dL (ref 6–20)
CHLORIDE: 101 mmol/L (ref 98–111)
CO2: 26 mmol/L (ref 22–32)
CREATININE: 0.6 mg/dL (ref 0.44–1.00)
Calcium: 8.9 mg/dL (ref 8.9–10.3)
GFR calc Af Amer: >60 mL/min (ref >60)
GFR calc non Af Amer: >60 mL/min (ref >60)
Glucose, Bld: 104 mg/dL — ABNORMAL HIGH (ref 70–99)
POTASSIUM: 3.9 mmol/L (ref 3.5–5.1)
SODIUM: 138 mmol/L (ref 135–145)

## 2018-03-09 LAB — CBC
HCT: 33.3 % — ABNORMAL LOW (ref 36.0–46.0)
HEMOGLOBIN: 10.9 g/dL — AB (ref 12.0–15.0)
MCH: 29.5 pg (ref 26.0–34.0)
MCHC: 32.7 g/dL (ref 30.0–36.0)
MCV: 90 fL (ref 78.0–100.0)
Platelets: 455 10*3/uL — ABNORMAL HIGH (ref 150–400)
RBC: 3.7 MIL/uL — AB (ref 3.87–5.11)
RDW: 13.5 % (ref 11.5–15.5)
WBC: 16.7 10*3/uL — ABNORMAL HIGH (ref 4.0–10.5)

## 2018-03-09 NOTE — Plan of Care (Signed)
  Problem: Activity: Goal: Risk for activity intolerance will decrease Outcome: Progressing   Problem: Nutrition: Goal: Adequate nutrition will be maintained Outcome: Progressing   Problem: Coping: Goal: Level of anxiety will decrease Outcome: Progressing   Problem: Elimination: Goal: Will not experience complications related to bowel motility Outcome: Progressing   

## 2018-03-09 NOTE — Discharge Instructions (Signed)
Please arrive at least 30 min before your appointment to complete your check in paperwork.  If you are unable to arrive 30 min prior to your appointment time we may have to cancel or reschedule you. ° °LAPAROSCOPIC SURGERY: POST OP INSTRUCTIONS  °1. DIET: Follow a light bland diet the first 24 hours after arrival home, such as soup, liquids, crackers, etc. Be sure to include lots of fluids daily. Avoid fast food or heavy meals as your are more likely to get nauseated. Eat a low fat the next few days after surgery.  °2. Take your usually prescribed home medications unless otherwise directed. °3. PAIN CONTROL:  °1. Pain is best controlled by a usual combination of three different methods TOGETHER:  °1. Ice/Heat °2. Over the counter pain medication °3. Prescription pain medication °2. Most patients will experience some swelling and bruising around the incisions. Ice packs or heating pads (30-60 minutes up to 6 times a day) will help. Use ice for the first few days to help decrease swelling and bruising, then switch to heat to help relax tight/sore spots and speed recovery. Some people prefer to use ice alone, heat alone, alternating between ice & heat. Experiment to what works for you. Swelling and bruising can take several weeks to resolve.  °3. It is helpful to take an over-the-counter pain medication regularly for the first few weeks. Choose one of the following that works best for you:  °1. Naproxen (Aleve, etc) Two 220mg tabs twice a day °2. Ibuprofen (Advil, etc) Three 200mg tabs four times a day (every meal & bedtime) °3. Acetaminophen (Tylenol, etc) 500-650mg four times a day (every meal & bedtime) °4. A prescription for pain medication (such as oxycodone, hydrocodone, etc) should be given to you upon discharge. Take your pain medication as prescribed.  °1. If you are having problems/concerns with the prescription medicine (does not control pain, nausea, vomiting, rash, itching, etc), please call us [phone]  [phone] to see if we need to switch you to a different pain medicine that will work better for you and/or control your side effect better. °2. If you need a refill on your pain medication, please contact your pharmacy. They will contact our office to request authorization. Prescriptions will not be filled after 5 pm or on week-ends. °4. Avoid getting constipated. Between the surgery and the pain medications, it is common to experience some constipation. Increasing fluid intake and taking a fiber supplement (such as Metamucil, Citrucel, FiberCon, MiraLax, etc) 1-2 times a day regularly will usually help prevent this problem from occurring. A mild laxative (prune juice, Milk of Magnesia, MiraLax, etc) should be taken according to package directions if there are no bowel movements after 48 hours.  °5. Watch out for diarrhea. If you have many loose bowel movements, simplify your diet to bland foods & liquids for a few days. Stop any stool softeners and decrease your fiber supplement. Switching to mild anti-diarrheal medications (Kayopectate, Pepto Bismol) can help. If this worsens or does not improve, please call us. °6. Wash / shower every day. You may shower over the dressings as they are waterproof. Continue to shower over incision(s) after the dressing is off. °7. Remove your waterproof bandages 5 days after surgery. You may leave the incision open to air. You may replace a dressing/Band-Aid to cover the incision for comfort if you wish.  °8. ACTIVITIES as tolerated:  °1. You may resume regular (light) daily activities beginning the next day--such as daily self-care, walking, climbing stairs--gradually   increasing activities as tolerated. If you can walk 30 minutes without difficulty, it is safe to try more intense activity such as jogging, treadmill, bicycling, low-impact aerobics, swimming, etc. 2. Save the most intensive and strenuous activity for last such as sit-ups, heavy lifting, contact sports, etc Refrain  from any heavy lifting or straining until you are off narcotics for pain control.  3. DO NOT PUSH THROUGH PAIN. Let pain be your guide: If it hurts to do something, don't do it. Pain is your body warning you to avoid that activity for another week until the pain goes down. 4. You may drive when you are no longer taking prescription pain medication, you can comfortably wear a seatbelt, and you can safely maneuver your car and apply brakes. 5. You may have sexual intercourse when it is comfortable.  9. FOLLOW UP in our office  1. Please call [facility] at [phone] to set up an appointment to see your surgeon in the office for a follow-up appointment approximately 2-3 weeks after your surgery. 2. Make sure that you call for this appointment the day you arrive home to insure a convenient appointment time.      10. IF YOU HAVE DISABILITY OR FAMILY LEAVE FORMS, BRING THEM TO THE               OFFICE FOR PROCESSING.   WHEN TO CALL US [phone]:  1. Poor pain control 2. Reactions / problems with new medications (rash/itching, nausea, etc)  3. Fever over 101.5 F (38.5 C) 4. Inability to urinate 5. Nausea and/or vomiting 6. Worsening swelling or bruising 7. Continued bleeding from incision. 8. Increased pain, redness, or drainage from the incision  The clinic staff is available to answer your questions during regular business hours (8:30am-5pm). Please dont hesitate to call and ask to speak to one of our nurses for clinical concerns.  If you have a medical emergency, go to the nearest emergency room or call 911.  A surgeon from [facility] is always on call at the [facility]   [facility] [place]  [address], [address], [place], [place] [address] ?  MAIN: [phone] ? TOLL FREE: [phone] ?  FAX [phone]  [email or web address]   [facility] This is for the drain in your abdomen Surgical drains are used to remove extra fluid  that normally builds up in a surgical wound after surgery. A surgical drain helps to heal a surgical wound. Different kinds of surgical drains include:  Active drains. These drains use suction to pull drainage away from the surgical wound. Drainage flows through a tube to a container outside of the body. It is important to keep the bulb or the drainage container flat (compressed) at all times, except while you empty it. Flattening the bulb or container creates suction. The two most common types of active drains are bulb drains and Hemovac drains.  Passive drains. These drains allow fluid to drain naturally, by gravity. Drainage flows through a tube to a bandage (dressing) or a container outside of the body. Passive drains do not need to be emptied. The most common type of passive drain is the Penrose drain.  A drain is placed during surgery. Immediately after surgery, drainage is usually bright red and a little thicker than water. The drainage may gradually turn yellow or pink and become thinner. It is likely that your health care provider will remove the drain when the drainage stops or when the  amount decreases to 1-2 Tbsp (15-30 mL) during a 24-hour period. How to care for your surgical drain  Keep the skin around the drain dry and covered with a dressing at all times.  Check your drain area every day for signs of infection. Check for: ? More redness, swelling, or pain. ? Pus or a bad smell. ? Cloudy drainage. Follow instructions from your health care provider about how to take care of your drain and how to change your dressing. Change your dressing at least one time every day. Change it more often if needed to keep the dressing dry. Make sure you: 1. Gather your supplies, including: ? Tape. ? Germ-free cleaning solution (sterile saline). ? Split gauze drain sponge: 4 x 4 inches (10 x 10 cm). ? Gauze square: 4 x 4 inches (10 x 10 cm). 2. Wash your hands with soap and water before you change  your dressing. If soap and water are not available, use hand sanitizer. 3. Remove the old dressing. Avoid using scissors to do that. 4. Use sterile saline to clean your skin around the drain. 5. Place the tube through the slit in a drain sponge. Place the drain sponge so that it covers your wound. 6. Place the gauze square or another drain sponge on top of the drain sponge that is on the wound. Make sure the tube is between those layers. 7. Tape the dressing to your skin. 8. If you have an active bulb or Hemovac drain, tape the drainage tube to your skin 1-2 inches (2.5-5 cm) below the place where the tube enters your body. Taping keeps the tube from pulling on any stitches (sutures) that you have. 9. Wash your hands with soap and water. 10. Write down the color of your drainage and how often you change your dressing.  How to empty your active bulb or Hemovac drain 1. Make sure that you have a measuring cup that you can empty your drainage into. 2. Wash your hands with soap and water. If soap and water are not available, use hand sanitizer. 3. Gently move your fingers down the tube while squeezing very lightly. This is called stripping the tube. This clears any drainage, clots, or tissue from the tube. ? Do not pull on the tube. ? You may need to strip the tube several times every day to keep the tube clear. 4. Open the bulb cap or the drain plug. Do not touch the inside of the cap or the bottom of the plug. 5. Empty all of the drainage into the measuring cup. 6. Compress the bulb or the container and replace the cap or the plug. To compress the bulb or the container, squeeze it firmly in the middle while you close the cap or plug the container. 7. Write down the amount of drainage that you have in each 24-hour period. If you have less than 2 Tbsp (30 mL) of drainage during 24 hours, contact your health care provider. 8. Flush the drainage down the toilet. 9. Wash your hands with soap and  water. Contact a health care provider if:  You have more redness, swelling, or pain around your drain area.  The amount of drainage that you have is increasing instead of decreasing.  You have pus or a bad smell coming from your drain area.  You have a fever.  You have drainage that is cloudy.  There is a sudden stop or a sudden decrease in the amount of drainage that you have.  Your tube falls out.  Your active draindoes not stay compressedafter you empty it. This information is not intended to replace advice given to you by your health care provider. Make sure you discuss any questions you have with your health care provider. Document Released: 06/03/2000 Document Revised: 11/12/2015 Document Reviewed: 12/24/2014 Elsevier Interactive Patient Education  [employer].    For the drain in your backside follow the instructions for flushing drain as discussed with you by interventional radiology

## 2018-03-09 NOTE — Progress Notes (Signed)
[facility] Progress Note  3 Days Post-Op   Assessment/Plan GERD Asthma Scoliosis  Perforated appendicitis with large pelvic abscess S/p IR TG drain 9/12, cx with no growth - drain with 70cc/24hr S/p laparoscopic drainage of pelvic abscess 9/17 Dr. Luisa Hartornett - JP drain with 85cc/24hr - WBC trending down, repeat CBC in AM. Afebrile - will need repeat CT scan about 10 days postop Hypokalemia - PO replenishment, am labs - resolved   ID -zosyn 9/11>>9/16, augmentin 9/16>> FEN -reg diet, Ensure VTE -SCDs, sq heparin Foley -none Follow up -IR, Dr. Luisa Hartornett  Plan- Continue drains and abx. Encouraged ambulation. Repeat CBC and BMP in AM.Possible discharge tomorrow if WBC is improved.    LOS: 8 days    Subjective: CC: perforated appendicitis  Pt is feeling much better today. Less abdominal pain. Having flatus, tolerating diet, no nausea or vomiting, fever or chills overnight. No family at bedside.   Objective: Vital signs in last 24 hours: Temp:  [98.2 F (36.8 C)-98.5 F (36.9 C)] 98.5 F (36.9 C) (09/20 0500) Pulse Rate:  [82-101] 82 (09/20 0500) Resp:  [16-17] 17 (09/20 0500) BP: (94-100)/(60-67) 96/62 (09/20 0500) SpO2:  [99 %-100 %] 100 % (09/20 0500) Last BM Date: 03/06/18  Intake/Output from previous day: 09/19 0701 - 09/20 0700 In: 490 [P.O.:480; I.V.:5] Out: 155 [Drains:155] Intake/Output this shift: Total I/O In: 240 [P.O.:240] Out: -   PE: Gen:  Alert, NAD, pleasant, cooperative Card:  RRR, no M/G/R heard Pulm:  CTA, no W/R/R, rate and effort normal Abd: Soft, mild distention, +BS, incisions with glue intact are well appearing, surgical drain with serous drainage, IR drain with serous drainage, mild TTP of left hemiabdomen without guarding. no peritonitis  Extremities: no TTP or swelling of calves b/l Skin: no rashes noted, warm and dry  Anti-infectives: Anti-infectives (From admission, onward)   Start     Dose/Rate  Route Frequency Ordered Stop   03/06/18 1200  ceFAZolin (ANCEF) IVPB 2g/100 mL premix     2 g 200 mL/hr over 30 Minutes Intravenous On call to O.R. 03/05/18 1547 03/06/18 1147   03/06/18 1130  ceFAZolin (ANCEF) IVPB 2g/100 mL premix  Status:  Discontinued     2 g 200 mL/hr over 30 Minutes Intravenous On call to O.R. 03/05/18 2333 03/06/18 1144   03/05/18 1000  amoxicillin-clavulanate (AUGMENTIN) 875-125 MG per tablet 1 tablet     1 tablet Oral Every 12 hours 03/05/18 0837     03/01/18 0630  piperacillin-tazobactam (ZOSYN) IVPB 3.375 g  Status:  Discontinued     3.375 g 12.5 mL/hr over 240 Minutes Intravenous Every 8 hours 03/01/18 0619 03/05/18 0837   02/28/18 2200  piperacillin-tazobactam (ZOSYN) IVPB 3.375 g     3.375 g 100 mL/hr over 30 Minutes Intravenous  Once 02/28/18 2154 02/28/18 2347      Lab Results:  Recent Labs    03/08/18 0426 03/09/18 0516  WBC 17.0* 16.7*  HGB 9.4* 10.9*  HCT 28.6* 33.3*  PLT 371 455*   BMET Recent Labs    03/07/18 0951 03/09/18 0516  NA 143 138  K 3.3* 3.9  CL 107 101  CO2 28 26  GLUCOSE 126* 104*  BUN 7 7  CREATININE 0.67 0.60  CALCIUM 8.8* 8.9   PT/INR No results for input(s): LABPROT, INR in the last 72 hours. CMP     Component Value Date/Time   NA 138 03/09/2018 0516   K 3.9 03/09/2018 0516   CL 101 03/09/2018 0516  CO2 26 03/09/2018 0516   GLUCOSE 104 (H) 03/09/2018 0516   BUN 7 03/09/2018 0516   CREATININE 0.60 03/09/2018 0516   CALCIUM 8.9 03/09/2018 0516   PROT 7.0 02/28/2018 1435   ALBUMIN 3.6 02/28/2018 1435   AST 18 02/28/2018 1435   ALT 13 02/28/2018 1435   ALKPHOS 88 02/28/2018 1435   BILITOT 0.7 02/28/2018 1435   GFRNONAA >60 03/09/2018 0516   GFRAA >60 03/09/2018 0516   Lipase     Component Value Date/Time   LIPASE 26 02/28/2018 1435    Studies/Results: No results found.    Jerre Simon , [facility] 03/09/2018, 9:13 AM  Pager: [phone] Mon-Wed, Friday  7:00am-4:30pm Thurs 7am-11:30am  Consults: [phone]

## 2018-03-10 LAB — BASIC METABOLIC PANEL
Anion gap: 13 (ref 5–15)
BUN: 13 mg/dL (ref 6–20)
CO2: 25 mmol/L (ref 22–32)
CREATININE: 0.64 mg/dL (ref 0.44–1.00)
Calcium: 9.4 mg/dL (ref 8.9–10.3)
Chloride: 102 mmol/L (ref 98–111)
Glucose, Bld: 102 mg/dL — ABNORMAL HIGH (ref 70–99)
Potassium: 4.6 mmol/L (ref 3.5–5.1)
SODIUM: 140 mmol/L (ref 135–145)

## 2018-03-10 LAB — CBC
HCT: 33.4 % — ABNORMAL LOW (ref 36.0–46.0)
Hemoglobin: 10.9 g/dL — ABNORMAL LOW (ref 12.0–15.0)
MCH: 29.4 pg (ref 26.0–34.0)
MCHC: 32.6 g/dL (ref 30.0–36.0)
MCV: 90 fL (ref 78.0–100.0)
PLATELETS: 523 10*3/uL — AB (ref 150–400)
RBC: 3.71 MIL/uL — AB (ref 3.87–5.11)
RDW: 13.3 % (ref 11.5–15.5)
WBC: 11.5 10*3/uL — AB (ref 4.0–10.5)

## 2018-03-10 MED ORDER — AMOXICILLIN-POT CLAVULANATE 875-125 MG PO TABS: 1.0000 | ORAL_TABLET | Freq: Two times a day (BID) | ORAL | 1 refills | Status: DC

## 2018-03-10 MED ORDER — OXYCODONE-ACETAMINOPHEN 5-325 MG PO TABS: 1.0000 | ORAL_TABLET | Freq: Four times a day (QID) | ORAL | 0 refills | Status: DC | PRN

## 2018-03-10 MED ORDER — FLUCONAZOLE 200 MG PO TABS: 200.0000 mg | ORAL_TABLET | Freq: Every day | ORAL | 2 refills | Status: DC

## 2018-03-10 NOTE — Progress Notes (Signed)
Lindsey Tucker to be D/C'd  per MD order. Discussed with the patient and all questions fully answered.  VSS, Skin clean, dry and intact without evidence of skin break down, no evidence of skin tears noted.  IV catheter discontinued intact. Site without signs and symptoms of complications. Dressing and pressure applied.  An After Visit Summary was printed and given to the patient. Patient received prescription.  D/c education completed with patient/family including follow up instructions, medication list, d/c activities limitations if indicated, with other d/c instructions as indicated by MD - patient able to verbalize understanding, all questions fully answered.   Patient instructed to return to ED, call 911, or call MD for any changes in condition.   Patient to be escorted via WC, and D/C home via private auto.

## 2018-03-10 NOTE — Progress Notes (Signed)
4 Days Post-Op   Subjective/Chief Complaint: Feels good. Wants to go home   Objective: Vital signs in last 24 hours: Temp:  [98 F (36.7 C)-98.5 F (36.9 C)] 98 F (36.7 C) (09/21 0528) Pulse Rate:  [78-98] 78 (09/21 0528) Resp:  [16-20] 16 (09/21 0528) BP: (84-103)/(51-74) 92/55 (09/21 0528) SpO2:  [99 %-100 %] 99 % (09/21 0528) Last BM Date: 03/06/18  Intake/Output from previous day: 09/20 0701 - 09/21 0700 In: 610 [P.O.:600] Out: 45 [Drains:45] Intake/Output this shift: No intake/output data recorded.  General appearance: alert and cooperative Resp: clear to auscultation bilaterally Cardio: regular rate and rhythm GI: soft, mild tenderness on left side of abd. good bs. drain output serous  Lab Results:  Recent Labs    03/09/18 0516 03/10/18 0527  WBC 16.7* 11.5*  HGB 10.9* 10.9*  HCT 33.3* 33.4*  PLT 455* 523*   BMET Recent Labs    03/09/18 0516 03/10/18 0527  NA 138 140  K 3.9 4.6  CL 101 102  CO2 26 25  GLUCOSE 104* 102*  BUN 7 13  CREATININE 0.60 0.64  CALCIUM 8.9 9.4   PT/INR No results for input(s): LABPROT, INR in the last 72 hours. ABG No results for input(s): PHART, HCO3 in the last 72 hours.  Invalid input(s): PCO2, PO2  Studies/Results: No results found.  Anti-infectives: Anti-infectives (From admission, onward)   Start     Dose/Rate Route Frequency Ordered Stop   03/06/18 1200  ceFAZolin (ANCEF) IVPB 2g/100 mL premix     2 g 200 mL/hr over 30 Minutes Intravenous On call to O.R. 03/05/18 1547 03/06/18 1147   03/06/18 1130  ceFAZolin (ANCEF) IVPB 2g/100 mL premix  Status:  Discontinued     2 g 200 mL/hr over 30 Minutes Intravenous On call to O.R. 03/05/18 2333 03/06/18 1144   03/05/18 1000  amoxicillin-clavulanate (AUGMENTIN) 875-125 MG per tablet 1 tablet     1 tablet Oral Every 12 hours 03/05/18 0837     03/01/18 0630  piperacillin-tazobactam (ZOSYN) IVPB 3.375 g  Status:  Discontinued     3.375 g 12.5 mL/hr over 240 Minutes  Intravenous Every 8 hours 03/01/18 0619 03/05/18 0837   02/28/18 2200  piperacillin-tazobactam (ZOSYN) IVPB 3.375 g     3.375 g 100 mL/hr over 30 Minutes Intravenous  Once 02/28/18 2154 02/28/18 2347      Assessment/Plan: s/p Procedure(s): LAPAROSCOPIC PELVIC ABSCESS DRAINAGE (N/A) Advance diet  Continue augmentin Pt can take care of drains Plan for d/c today  LOS: 9 days    TOTH III,Brahm Barbeau S 03/10/2018

## 2018-03-12 ENCOUNTER — Other Ambulatory Visit: Payer: Self-pay | Admitting: Surgery

## 2018-03-12 DIAGNOSIS — K3533 Acute appendicitis with perforation and localized peritonitis, with abscess: Secondary | ICD-10-CM

## 2018-03-14 NOTE — Discharge Summary (Signed)
[facility] Discharge Summary   Patient ID: Lindsey Tucker MRN: [id number] DOB/AGE: [birth year] 31 y.o.  Admit date: 02/28/2018 Discharge date: 03/10/2018  Admitting Diagnosis: Perforated appendicitis with large pelvic abscess  Discharge Diagnosis Patient Active Problem List   Diagnosis Date Noted  . Appendicitis with abscess 03/01/2018  . Postpartum care following cesarean delivery (8/14) 02/02/2016  . Breech presentation delivered 02/01/2016  . Asthma 02/24/2011    Consultants Interventional radiology  Imaging: No results found.  Procedures Dr. Loreta Ave (03/01/18) - left transgluteal drain placement Dr. Luisa Hart (03/06/18) - laparoscopic drainage of pelvic abscess  Hospital Course:  Patient is a 32 year old female who presented to [facility] with abdominal pain.  Workup showed ruptured appendicitis with multiple abscesses.  Patient was admitted and interventional radiology was consulted for percutaneous drain placement. IR did not feel one of the the abscesses was in a place amenable to percutaneous drainage, but drained the pelvic abscess. Patient clinically was improving and diet was advanced as tolerated. Follow up CT scan 9/16 showed drain in place but increase in size of pelvic fluid collection, decision was made to take the patient to the OR for laparoscopic drainage of abscess with washout. Tolerated procedure well and was transferred to the floor.  Diet was advanced as tolerated.  On POD#4, the patient was voiding well, tolerating diet, ambulating well, pain well controlled, vital signs stable, incisions c/d/i and felt stable for discharge home.  Patient will follow up in our office in 2 weeks and knows to call with questions or concerns. She will call to confirm appointment date/time.     Allergies as of 03/10/2018      Reactions   Morphine And Related Itching, Swelling      Medication List    TAKE these medications   amoxicillin-clavulanate 875-125 MG  tablet Commonly known as:  AUGMENTIN Take 1 tablet by mouth every 12 (twelve) hours.   fluconazole 200 MG tablet Commonly known as:  DIFLUCAN Take 1 tablet (200 mg total) by mouth daily.   ibuprofen 800 MG tablet Commonly known as:  ADVIL,MOTRIN Take 1 tablet (800 mg total) by mouth every 8 (eight) hours as needed for mild pain, moderate pain or cramping.   loratadine 10 MG tablet Commonly known as:  CLARITIN Take 10 mg by mouth daily as needed for allergies.   naproxen sodium 220 MG tablet Commonly known as:  ALEVE Take 220 mg by mouth 2 (two) times daily as needed (pain).   oxyCODONE-acetaminophen 5-325 MG tablet Commonly known as:  PERCOCET/ROXICET Take 1 tablet by mouth every 6 (six) hours as needed for moderate pain or severe pain.        Follow-up Information    Gilmer Mor, DO Follow up in 10 day(s).   Specialties:  Interventional Radiology, Radiology Why:  scheduler will call pt with time and date for IR follow up of drain-- call [phone] if needs or questions Contact information: [address] [place] [address] [phone]        Harriette Bouillon, MD. Go on 03/23/2018.   Specialty:  General Surgery Why:  at 9:00am. Please arrive 30 minutes prior to complete paperwork. Please bring photo ID and insurance card. Please be sure to have CT scan prior to this appointment.  Contact information: [address] [place] [address] [phone]        [facility]. Call.   Why:  they will call you with an appointment for your CT  scan. Please call them if you do not hear from them by 09/26  Contact information: [address] [place] [phone]          Signed: [name], [facility] 03/14/2018, 3:37 PM Pager: [phone] Consults: [phone] Mon-Fri 7:00 am-4:30 pm Sat-Sun 7:00 am-11:30 am

## 2018-03-20 ENCOUNTER — Encounter: Payer: Self-pay | Admitting: Radiology

## 2018-03-20 ENCOUNTER — Ambulatory Visit
Admission: RE | Admit: 2018-03-20 | Discharge: 2018-03-20 | Disposition: A | Discharge status: Home / Self Care | Payer: 59 | Source: Ambulatory Visit | Attending: Surgery | Admitting: Surgery

## 2018-03-20 ENCOUNTER — Other Ambulatory Visit (HOSPITAL_COMMUNITY): Payer: Self-pay | Admitting: Interventional Radiology

## 2018-03-20 ENCOUNTER — Ambulatory Visit
Admission: RE | Admit: 2018-03-20 | Discharge: 2018-03-20 | Disposition: A | Discharge status: Home / Self Care | Payer: 59 | Source: Ambulatory Visit | Attending: Radiology | Admitting: Radiology

## 2018-03-20 DIAGNOSIS — K3533 Acute appendicitis with perforation and localized peritonitis, with abscess: Secondary | ICD-10-CM

## 2018-03-20 HISTORY — PX: IR RADIOLOGIST EVAL & MGMT: IMG5224

## 2018-03-20 MED ORDER — IOPAMIDOL (ISOVUE-300) INJECTION 61%
100.0000 mL | Freq: Once | INTRAVENOUS | Status: AC | PRN
  Administered 2018-03-20: 100 mL via INTRAVENOUS

## 2018-03-20 NOTE — Progress Notes (Signed)
Chief Complaint: Patient was seen in consultation today for follow up pelvic abscess drain at the request of Dr. Erroll Luna  Referring Physician(s): Dr. Erroll Luna  History of Present Illness: Lindsey Tucker is a 32 y.o. female who was admitted with appendicitis and adjacent abscess formation. She underwent IR perc drain placement via transgluteal approach on 9/13. On 9/16, repeat CT showed enlarging right paramidline pelvic abscess. She was taken to the OR and underwent laparoscopic drainage and washout with placement of large surgical drain. The TG drain was left intact. She is now here for follow up imaging. She has kept a good log of output, still getting anywhere from 20-30m per day from the TG drain. Hazy, serous in color. Husband at bedside with her.  Past Medical History:  Diagnosis Date  . Asthma   . GERD (gastroesophageal reflux disease)   . Postpartum care following cesarean delivery (8/14) 02/02/2016  . Scoliosis   . Vacuum extractor delivery, delivered (12/12) 06/01/2013    Past Surgical History:  Procedure Laterality Date  . CESAREAN SECTION N/A 02/01/2016   Procedure: Primary CESAREAN SECTION;  Surgeon: KAloha Gell MD;  [place]  Service: Obstetrics;  Laterality: N/A;  EDD: 02/08/16 *Time ok per CLenon Curt  . IR RADIOLOGIST EVAL & MGMT  03/20/2018  . LAPAROSCOPIC ABDOMINAL EXPLORATION N/A 03/06/2018   Procedure: LAPAROSCOPIC PELVIC ABSCESS DRAINAGE;  Surgeon: CErroll Luna MD;  [place]  Service: General;  Laterality: N/A;  . SPINAL GROWTH RODS  2000, 2003  . WISDOM TOOTH EXTRACTION      Allergies: Morphine and related  Medications: Prior to Admission medications   Medication Sig Start Date End Date Taking? Authorizing Provider  amoxicillin-clavulanate (AUGMENTIN) 875-125 MG tablet Take 1 tablet by mouth every 12 (twelve) hours. 03/10/18   TAutumn MessingIII, MD  fluconazole (DIFLUCAN) 200 MG tablet Take 1 tablet (200  mg total) by mouth daily. 03/10/18   TJovita Kussmaul MD  ibuprofen (ADVIL,MOTRIN) 800 MG tablet Take 1 tablet (800 mg total) by mouth every 8 (eight) hours as needed for mild pain, moderate pain or cramping. Patient not taking: Reported on 02/28/2018 02/04/16   DLaury Deep CNM  loratadine (CLARITIN) 10 MG tablet Take 10 mg by mouth daily as needed for allergies.     [provider]  naproxen sodium (ALEVE) 220 MG tablet Take 220 mg by mouth 2 (two) times daily as needed (pain).    [provider]  oxyCODONE-acetaminophen (PERCOCET/ROXICET) 5-325 MG tablet Take 1 tablet by mouth every 6 (six) hours as needed for moderate pain or severe pain. 03/10/18   TJovita Kussmaul MD     Family History  Problem Relation Age of Onset  . Hypertension Mother   . Hypertension Father   . Diabetes Maternal Grandfather   . Cancer Maternal Grandfather        prostate  . Cancer Paternal Grandmother        breast    Social History   Socioeconomic History  . Marital status: Married    Spouse name: Not on file  . Number of children: Not on file  . Years of education: Not on file  . Highest education level: Not on file  Occupational History  . Not on file  Social Needs  . Financial resource strain: Not on file  . Food insecurity:    Worry: Not on file    Inability: Not on file  . Transportation needs:    Medical:  Not on file    Non-medical: Not on file  Tobacco Use  . Smoking status: Never Smoker  . Smokeless tobacco: Never Used  Substance and Sexual Activity  . Alcohol use: Yes    Alcohol/week: 2.0 standard drinks    Types: 2 Shots of liquor per week  . Drug use: No  . Sexual activity: Yes    Birth control/protection: Pill  Lifestyle  . Physical activity:    Days per week: Not on file    Minutes per session: Not on file  . Stress: Not on file  Relationships  . Social connections:    Talks on phone: Not on file    Gets together: Not on file    Attends religious  service: Not on file    Active member of club or organization: Not on file    Attends meetings of clubs or organizations: Not on file    Relationship status: Not on file  Other Topics Concern  . Not on file  Social History Narrative  . Not on file     Review of Systems: A 12 point ROS discussed and pertinent positives are indicated in the HPI above.  All other systems are negative.  Review of Systems  Vital Signs: BP 98/77   Pulse (!) 101   Temp 98.1 F (36.7 C)   SpO2 97%   Physical Exam Drain intact, site clean.  Imaging: Ct Abdomen Pelvis Wo Contrast  Result Date: 03/05/2018 CLINICAL DATA:  Abscesses, appendicitis. EXAM: CT ABDOMEN AND PELVIS WITHOUT CONTRAST TECHNIQUE: Multidetector CT imaging of the abdomen and pelvis was performed following the standard protocol without IV contrast. COMPARISON:  02/28/2018. FINDINGS: Lower chest: Lung bases are clear. Heart size normal. No pericardial or pleural effusion. Hepatobiliary: Low-attenuation lesions in the liver measure up to 9 mm in the left hepatic lobe and are likely cysts. Liver and gallbladder are otherwise unremarkable. No biliary ductal dilatation. Pancreas: Negative. Spleen: Negative. Adrenals/Urinary Tract: Adrenal glands and kidneys are unremarkable. Bladder is grossly unremarkable. Stomach/Bowel: Stomach, small bowel and colon are unremarkable. Vascular/Lymphatic: Vascular structures are unremarkable. No definite pathologically enlarged lymph nodes. Reproductive: Uterus is visualized.  No adnexal mass. Other: There is a large collection of fluid and air in the right paramidline anatomic pelvis, measuring 6.7 x 7.3 cm, previously 2.7 x 4.6 cm. A percutaneous drain terminates along the right posterolateral margin of the fluid collection. Small amount of stranding/fluid in the right paracolic gutter. Musculoskeletal: Scoliosis with Harrington rods in place. IMPRESSION: Enlarging right paramidline pelvic abscess. Percutaneous drain  is seen along the right posterolateral margin, possibly residing outside the abscess. Electronically Signed   By: Lorin Picket M.D.   On: 03/05/2018 07:51   Ct Abdomen Pelvis W Contrast  Result Date: 02/28/2018 CLINICAL DATA:  Lower abdominal pain, nausea, vomiting, diarrhea, fever for 4 days EXAM: CT ABDOMEN AND PELVIS WITH CONTRAST TECHNIQUE: Multidetector CT imaging of the abdomen and pelvis was performed using the standard protocol following bolus administration of intravenous contrast. CONTRAST:  146m OMNIPAQUE IOHEXOL 300 MG/ML  SOLN COMPARISON:  None. FINDINGS: Lower chest: No acute abnormality. Hepatobiliary: Small subcentimeter left lobe hypodensities are nonspecific. Mild pericholecystic fluid. No gallstones. No obvious gallbladder wall thickening. Pancreas: Unremarkable Spleen: Unremarkable Adrenals/Urinary Tract: Kidneys and adrenal glands are unremarkable. Stomach/Bowel: There is no enteric contrast within bowel loops. Multiple fluid-filled small and large bowel loops are present in the abdomen and pelvis. This limits evaluation for abscess and perforation. There is a tubular structure containing gas  and 2 calcific densities in the upper pelvis on image 51 through 60. It measures up to 13 mm in caliber and is worrisome for a distended appendix. There is a fluid and gas filled structure to the right of this tubular structure measuring 2.7 x 4.6 cm containing bubbles of gas. It appears blind ending and is worrisome for an abscess. It is difficult to differentiate this from a bowel loop without oral contrast. Vascular/Lymphatic: No evidence of aortic aneurysm. No abnormal retroperitoneal adenopathy. The right ovarian vein is distended. 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 Reproductive: Uterus is within normal limits. Other: There is free fluid layering in the pelvis surrounding the distal sigmoid and rectum. Musculoskeletal: Thoracolumbar fusion hardware is in place. Severe scoliosis is present.  IMPRESSION: The above findings are worrisome for acute appendicitis and adjacent abscess formation. The suspected distended appendix containing phleboliths measures up to 13 mm in caliber. The suspected abscess measures up to 4.6 cm in diameter. There is no oral contrast therefore it is difficult to differentiate the dilated appendix and adjacent suspected abscess with normal fluid-filled bowel loops. Other words, these abnormalities may simply represent fluid-filled bowel loops. The above findings are therefore indeterminate. Confirmation with a repeat study and oral contrast may be helpful. Critical Value/emergent results were called by telephone at the time of interpretation on 02/28/2018 at 9:20 pm to Dr. Pearlie Oyster , who verbally acknowledged these results. Free-fluid in the pelvis. 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 Scoliosis and postoperative changes in the lumbar spine. Indeterminate subcentimeter liver hypodensities. If there is risk or history of malignancy, consider follow-up MRI with contrast. Electronically Signed   By: Marybelle Killings M.D.   On: 02/28/2018 21:26   Ct Image Guided Fluid Drain By Catheter  Result Date: 03/02/2018 INDICATION: 32 year old female with ruptured appendicitis. The presumed abscess adjacent to the ruptured appendix is non accessible via percutaneous method. She has been referred for drainage of pelvic fluid collection with trans gluteal drain. EXAM: CT-GUIDED DRAINAGE MEDICATIONS: The patient is currently admitted to the hospital and receiving intravenous antibiotics. The antibiotics were administered within an appropriate time frame prior to the initiation of the procedure. ANESTHESIA/SEDATION: Fentanyl 100 mcg IV; Versed 2.0 mg IV Moderate Sedation Time:  34 minutes The patient was continuously monitored during the procedure by the interventional radiology nurse under my direct supervision. COMPLICATIONS: None PROCEDURE: Informed written consent was  obtained from the patient after a thorough discussion of the procedural risks, benefits and alternatives. All questions were addressed. Maximal Sterile Barrier Technique was utilized including caps, mask, sterile gowns, sterile gloves, sterile drape, hand hygiene and skin antiseptic. A timeout was performed prior to the initiation of the procedure. Patient position prone on the CT table. Images of the pelvis were performed with images stored and sent to PACs. Once the patient is prepped and draped in usual sterile fashion. 1% lidocaine was used for local anesthesia. Small stab incision was made. Trocar needle was placed in the pelvic fluid collection. Using modified Seldinger technique, 10 French drain was placed into the pelvic fluid collection. Aspirate was achieved with approximately 40 cc of serosanguineous fluid. Sample sent for culture. Patient tolerated the procedure well and remained hemodynamically stable throughout. No complications were encountered and no significant blood loss. IMPRESSION: CT-guided placement of left trans gluteal drain for pelvic fluid collection. Sample was sent for a culture. Signed, Dulcy Fanny. Dellia Nims, [facility] Radiology Electronically Signed   By: York Cerise  Earleen Newport D.O.   On: 03/02/2018 08:43   Ir Radiologist Eval & Mgmt  Result Date: 03/20/2018 Please refer to notes tab for details about interventional procedure. (Op Note)   Labs:  CBC: Recent Labs    03/07/18 0951 03/08/18 0426 03/09/18 0516 03/10/18 0527  WBC 16.4* 17.0* 16.7* 11.5*  HGB 10.2* 9.4* 10.9* 10.9*  HCT 31.8* 28.6* 33.3* 33.4*  PLT 405* 371 455* 523*    COAGS: Recent Labs    03/01/18 1023  INR 1.20    BMP: Recent Labs    03/06/18 0535 03/07/18 0951 03/09/18 0516 03/10/18 0527  NA 143 143 138 140  K 3.2* 3.3* 3.9 4.6  CL 107 107 101 102  CO2 _0 GLUCOSE 111* 126* 104* 102*  BUN <5* _1 CALCIUM 8.3* 8.8* 8.9 9.4    CREATININE 0.63 0.67 0.60 0.64  GFRNONAA >60 >60 >60 >60  GFRAA >60 >60 >60 >60    LIVER FUNCTION TESTS: Recent Labs    02/28/18 1435  BILITOT 0.7  AST 18  ALT 13  ALKPHOS 88  PROT 7.0  ALBUMIN 3.6    TUMOR MARKERS: No results for input(s): AFPTM, CEA, CA199, CHROMGRNA in the last 8760 hours.  Assessment and Plan: Ruptured appendicits with abscess. S/p TG perc drain. CT today shows inerval improvement, though there are still some residual collections in the pelvis that may not communicate with the drain. Drain injection does not show definite fistula, but may show contrast collecting in residual abscess. Attempted to pull back drain into residual collection under fluoro but met resistance and pt c/o pain. Pt will be scheduled for drain reposition/exchange this week. She is otherwise to f/u with surgeons later this week.  Thank you for this interesting consult.  I greatly enjoyed meeting Lindsey Tucker and look forward to participating in their care.  A copy of this report was sent to the requesting provider on this date.  Electronically Signed: Ascencion Dike 03/20/2018, 2:53 PM   I spent a total of 20 minutes in face to face in clinical consultation, greater than 50% of which was counseling/coordinating care for pelvic abscess drain care

## 2018-03-21 ENCOUNTER — Other Ambulatory Visit: Payer: Self-pay | Admitting: Radiology

## 2018-03-22 ENCOUNTER — Encounter (HOSPITAL_COMMUNITY): Payer: Self-pay | Admitting: Interventional Radiology

## 2018-03-22 ENCOUNTER — Ambulatory Visit (HOSPITAL_COMMUNITY)
Admission: RE | Admit: 2018-03-22 | Discharge: 2018-03-22 | Disposition: A | Discharge status: Home / Self Care | Payer: 59 | Source: Ambulatory Visit | Attending: Interventional Radiology | Admitting: Interventional Radiology

## 2018-03-22 ENCOUNTER — Other Ambulatory Visit: Payer: Self-pay

## 2018-03-22 DIAGNOSIS — K3533 Acute appendicitis with perforation and localized peritonitis, with abscess: Secondary | ICD-10-CM

## 2018-03-22 DIAGNOSIS — L03319 Cellulitis of trunk, unspecified: Secondary | ICD-10-CM | POA: Insufficient documentation

## 2018-03-22 DIAGNOSIS — L02219 Cutaneous abscess of trunk, unspecified: Secondary | ICD-10-CM | POA: Diagnosis not present

## 2018-03-22 DIAGNOSIS — J45909 Unspecified asthma, uncomplicated: Secondary | ICD-10-CM | POA: Diagnosis not present

## 2018-03-22 DIAGNOSIS — K219 Gastro-esophageal reflux disease without esophagitis: Secondary | ICD-10-CM | POA: Insufficient documentation

## 2018-03-22 DIAGNOSIS — Z4803 Encounter for change or removal of drains: Secondary | ICD-10-CM | POA: Insufficient documentation

## 2018-03-22 DIAGNOSIS — Z885 Allergy status to narcotic agent status: Secondary | ICD-10-CM | POA: Insufficient documentation

## 2018-03-22 HISTORY — PX: IR CATHETER TUBE CHANGE: IMG717

## 2018-03-22 LAB — PREGNANCY, URINE: Preg Test, Ur: NEGATIVE

## 2018-03-22 MED ORDER — LIDOCAINE HCL 1 % IJ SOLN
INTRAMUSCULAR | Status: AC
  Filled 2018-03-22: qty 20

## 2018-03-22 MED ORDER — MIDAZOLAM HCL 2 MG/2ML IJ SOLN
INTRAMUSCULAR | Status: AC
  Filled 2018-03-22: qty 4

## 2018-03-22 MED ORDER — SODIUM CHLORIDE 0.9% FLUSH: 5.0000 mL | Freq: Three times a day (TID) | INTRAVENOUS | Status: DC

## 2018-03-22 MED ORDER — LIDOCAINE HCL 1 % IJ SOLN
INTRAMUSCULAR | Status: AC | PRN
  Administered 2018-03-22: 5 mL

## 2018-03-22 MED ORDER — FENTANYL CITRATE (PF) 100 MCG/2ML IJ SOLN
INTRAMUSCULAR | Status: AC | PRN
  Administered 2018-03-22 (×2): 50 ug via INTRAVENOUS

## 2018-03-22 MED ORDER — SODIUM CHLORIDE 0.9 % IV SOLN: INTRAVENOUS | Status: DC

## 2018-03-22 MED ORDER — IOPAMIDOL (ISOVUE-300) INJECTION 61%
INTRAVENOUS | Status: AC
  Administered 2018-03-22: 10 mL
  Filled 2018-03-22: qty 50

## 2018-03-22 MED ORDER — FENTANYL CITRATE (PF) 100 MCG/2ML IJ SOLN
INTRAMUSCULAR | Status: AC
  Filled 2018-03-22: qty 4

## 2018-03-22 MED ORDER — MIDAZOLAM HCL 2 MG/2ML IJ SOLN
INTRAMUSCULAR | Status: AC | PRN
  Administered 2018-03-22 (×2): 1 mg via INTRAVENOUS

## 2018-03-22 NOTE — Progress Notes (Signed)
Chief Complaint: Patient was seen in consultation today for follow up pelvic abscess, perforated appendix  Referring Physician(s): Dr. Harriette Bouillon  History of Present Illness: Lindsey Tucker is a 32 y.o. female who underwent IR perc drain placement via transgluteal approach on 9/13. On 9/16, repeat CT showed enlarging right paramidline pelvic abscess and she then underwent surgery for laparoscopic drainage and washout with placement of a large surgical drain.  Her TG drain was left in place.  She as seen in IR drain clinic Tuesday of this week for injection which showed improvement, but residual fluid not likely communicating with current drain.  Manipulation was attempted, however was very painful and therefore she was scheduled for drain exchange/repositioning with sedation today.   She present today in her usual state of health.  Complains of abdominal pain, pelvic pressure.  Is having to use pain medication as needed at home.  She has been NPO.  She does not take blood thinners.   Past Medical History:  Diagnosis Date  . Asthma   . GERD (gastroesophageal reflux disease)   . Postpartum care following cesarean delivery (8/14) 02/02/2016  . Scoliosis   . Vacuum extractor delivery, delivered (12/12) 06/01/2013    Past Surgical History:  Procedure Laterality Date  . CESAREAN SECTION N/A 02/01/2016   Procedure: Primary CESAREAN SECTION;  Surgeon: Noland Fordyce, MD;  Location: [facility] BIRTHING SUITES;  Service: Obstetrics;  Laterality: N/A;  EDD: 02/08/16 *Time ok per Izora Ribas*  . IR RADIOLOGIST EVAL & MGMT  03/20/2018  . LAPAROSCOPIC ABDOMINAL EXPLORATION N/A 03/06/2018   Procedure: LAPAROSCOPIC PELVIC ABSCESS DRAINAGE;  Surgeon: Harriette Bouillon, MD;  Location: [facility] OR;  Service: General;  Laterality: N/A;  . SPINAL GROWTH RODS  2000, 2003  . WISDOM TOOTH EXTRACTION      Allergies: Morphine and related  Medications: Prior to Admission medications   Medication Sig Start Date End  Date Taking? Authorizing Provider  amoxicillin-clavulanate (AUGMENTIN) 875-125 MG tablet Take 1 tablet by mouth every 12 (twelve) hours. 03/10/18   Chevis Pretty III, MD  fluconazole (DIFLUCAN) 200 MG tablet Take 1 tablet (200 mg total) by mouth daily. 03/10/18   Griselda Miner, MD  ibuprofen (ADVIL,MOTRIN) 800 MG tablet Take 1 tablet (800 mg total) by mouth every 8 (eight) hours as needed for mild pain, moderate pain or cramping. Patient not taking: Reported on 02/28/2018 02/04/16   [name], CNM  loratadine (CLARITIN) 10 MG tablet Take 10 mg by mouth daily as needed for allergies.     [provider]  naproxen sodium (ALEVE) 220 MG tablet Take 220 mg by mouth 2 (two) times daily as needed (pain).    [provider]  oxyCODONE-acetaminophen (PERCOCET/ROXICET) 5-325 MG tablet Take 1 tablet by mouth every 6 (six) hours as needed for moderate pain or severe pain. 03/10/18   Griselda Miner, MD     Family History  Problem Relation Age of Onset  . Hypertension Mother   . Hypertension Father   . Diabetes Maternal Grandfather   . Cancer Maternal Grandfather        prostate  . Cancer Paternal Grandmother        breast    Social History   Socioeconomic History  . Marital status: Married    Spouse name: Not on file  . Number of children: Not on file  . Years of education: Not on file  . Highest education level: Not on file  Occupational History  . Not on file  Social Needs  . Financial resource strain: Not on file  . Food insecurity:    Worry: Not on file    Inability: Not on file  . Transportation needs:    Medical: Not on file    Non-medical: Not on file  Tobacco Use  . Smoking status: Never Smoker  . Smokeless tobacco: Never Used  Substance and Sexual Activity  . Alcohol use: Yes    Alcohol/week: 2.0 standard drinks    Types: 2 Shots of liquor per week  . Drug use: No  . Sexual activity: Yes    Birth control/protection: Pill  Lifestyle  . Physical activity:     Days per week: Not on file    Minutes per session: Not on file  . Stress: Not on file  Relationships  . Social connections:    Talks on phone: Not on file    Gets together: Not on file    Attends religious service: Not on file    Active member of club or organization: Not on file    Attends meetings of clubs or organizations: Not on file    Relationship status: Not on file  Other Topics Concern  . Not on file  Social History Narrative  . Not on file    Vital Signs: BP (!) 83/66 (BP Location: Right Arm)   Pulse 95   Temp 97.9 F (36.6 C) (Oral)   Ht 5' (1.524 m)   Wt 90 lb (40.8 kg)   LMP 03/01/2018 (Exact Date)   SpO2 100%   BMI 17.58 kg/m   Physical Exam  Constitutional: She is oriented to person, place, and time. She appears well-developed.  Cardiovascular: Normal rate, regular rhythm and normal heart sounds.  Pulmonary/Chest: Effort normal and breath sounds normal. No respiratory distress.  Abdominal: Soft. She exhibits no distension.  TG drain in place.  Insertion site intact.  No skin stitch remaining.   Neurological: She is alert and oriented to person, place, and time.  Psychiatric: She has a normal mood and affect. Her behavior is normal. Judgment and thought content normal.  Nursing note and vitals reviewed.   Imaging: Ct Abdomen Pelvis Wo Contrast  Result Date: 03/05/2018 CLINICAL DATA:  Abscesses, appendicitis. EXAM: CT ABDOMEN AND PELVIS WITHOUT CONTRAST TECHNIQUE: Multidetector CT imaging of the abdomen and pelvis was performed following the standard protocol without IV contrast. COMPARISON:  02/28/2018. FINDINGS: Lower chest: Lung bases are clear. Heart size normal. No pericardial or pleural effusion. Hepatobiliary: Low-attenuation lesions in the liver measure up to 9 mm in the left hepatic lobe and are likely cysts. Liver and gallbladder are otherwise unremarkable. No biliary ductal dilatation. Pancreas: Negative. Spleen: Negative. Adrenals/Urinary Tract:  Adrenal glands and kidneys are unremarkable. Bladder is grossly unremarkable. Stomach/Bowel: Stomach, small bowel and colon are unremarkable. Vascular/Lymphatic: Vascular structures are unremarkable. No definite pathologically enlarged lymph nodes. Reproductive: Uterus is visualized.  No adnexal mass. Other: There is a large collection of fluid and air in the right paramidline anatomic pelvis, measuring 6.7 x 7.3 cm, previously 2.7 x 4.6 cm. A percutaneous drain terminates along the right posterolateral margin of the fluid collection. Small amount of stranding/fluid in the right paracolic gutter. Musculoskeletal: Scoliosis with Harrington rods in place. IMPRESSION: Enlarging right paramidline pelvic abscess. Percutaneous drain is seen along the right posterolateral margin, possibly residing outside the abscess. Electronically Signed   By: Leanna Battles M.D.   On: 03/05/2018 07:51   Ct Abdomen Pelvis W Contrast  Result Date: 03/20/2018 CLINICAL  DATA:  Ruptured appendicitis with abscess x 2Drains places 9/12 and 9/17Hx c-section and laproscopic to clean abscessNo caPrior in pacsFlushing 2 times a dayBack drain - draining 30ml or less dailyFront drain - draining 30-62ml dailyCurrently on abx EXAM: CT ABDOMEN AND PELVIS WITH CONTRAST TECHNIQUE: Multidetector CT imaging of the abdomen and pelvis was performed using the standard protocol following bolus administration of intravenous contrast. CONTRAST:  ISOVUE-300 IOPAMIDOL (ISOVUE-300) INJECTION 61% COMPARISON:  03/05/2018 FINDINGS: Lower chest: No pleural or pericardial effusion. Bilateral breast prostheses. Visualized lung bases clear. Hepatobiliary: Stable subcentimeter probable cysts in hepatic segments 2 and 4B. New 3.1 cm low-attenuation subcapsular process in segment 6/7, containing a 9 mm hyperdense or enhancing focus. No biliary ductal dilatation. Gallbladder is nondistended. Pancreas: Unremarkable. No pancreatic ductal dilatation or surrounding  inflammatory changes. Spleen: Normal in size without focal abnormality. Adrenals/Urinary Tract: Normal adrenal glands. Normal kidneys. Urinary bladder is nondistended. Stomach/Bowel: Stomach is nondilated. Small bowel decompressed. Appendix not discretely identified. Moderate fecal material in the nondilated cecum. The colon is decompressed. Vascular/Lymphatic: No significant vascular findings are present. No enlarged abdominal or pelvic lymph nodes. Reproductive: Uterus and bilateral adnexa are unremarkable. Other: Surgical drain from right lower quadrant approach around the periphery of the peritoneal cavity anteriorly, tip in the right anterior pelvis. Left transgluteal pigtail drain catheter extends across the midline into cul-de-sac collection which is significantly decompressed with a small amount of residual enhancing fluid bilaterally, measured up to 3.9 cm axial diameter. Musculoskeletal: Thoracolumbar levoscoliosis with posterior fixation and fusion hardware extending down to L4 posterior elements. No fracture or worrisome bone lesion. IMPRESSION: 1. Significant interval improvement in cul-de-sac abscess post percutaneous drainage. There is a small amount of residual enhancing fluid posteriorly. 2. Resolution of right lower quadrant anterior peritoneal abscess with placement surgical drain. 3. New 3.1 cm subcapsular hepatic lesion in the posterior right right lobe, suspect abscess. Electronically Signed   By: Corlis Leak M.D.   On: 03/20/2018 16:44   Ct Abdomen Pelvis W Contrast  Result Date: 02/28/2018 CLINICAL DATA:  Lower abdominal pain, nausea, vomiting, diarrhea, fever for 4 days EXAM: CT ABDOMEN AND PELVIS WITH CONTRAST TECHNIQUE: Multidetector CT imaging of the abdomen and pelvis was performed using the standard protocol following bolus administration of intravenous contrast. CONTRAST:  OMNIPAQUE IOHEXOL 300 MG/ML  SOLN COMPARISON:  None. FINDINGS: Lower chest: No acute abnormality.  Hepatobiliary: Small subcentimeter left lobe hypodensities are nonspecific. Mild pericholecystic fluid. No gallstones. No obvious gallbladder wall thickening. Pancreas: Unremarkable Spleen: Unremarkable Adrenals/Urinary Tract: Kidneys and adrenal glands are unremarkable. Stomach/Bowel: There is no enteric contrast within bowel loops. Multiple fluid-filled small and large bowel loops are present in the abdomen and pelvis. This limits evaluation for abscess and perforation. There is a tubular structure containing gas and 2 calcific densities in the upper pelvis on image 51 through 60. It measures up to 13 mm in caliber and is worrisome for a distended appendix. There is a fluid and gas filled structure to the right of this tubular structure measuring 2.7 x 4.6 cm containing bubbles of gas. It appears blind ending and is worrisome for an abscess. It is difficult to differentiate this from a bowel loop without oral contrast. Vascular/Lymphatic: No evidence of aortic aneurysm. No abnormal retroperitoneal adenopathy. The right ovarian vein is distended. 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 Reproductive: Uterus is within normal limits. Other: There is free fluid layering in the pelvis surrounding the distal sigmoid and rectum. Musculoskeletal: Thoracolumbar fusion hardware is in  place. Severe scoliosis is present. IMPRESSION: The above findings are worrisome for acute appendicitis and adjacent abscess formation. The suspected distended appendix containing phleboliths measures up to 13 mm in caliber. The suspected abscess measures up to 4.6 cm in diameter. There is no oral contrast therefore it is difficult to differentiate the dilated appendix and adjacent suspected abscess with normal fluid-filled bowel loops. Other words, these abnormalities may simply represent fluid-filled bowel loops. The above findings are therefore indeterminate. Confirmation with a repeat study and oral contrast may be helpful. Critical  Value/emergent results were called by telephone at the time of interpretation on 02/28/2018 at 9:20 pm to Dr. Elizabeth Sauer , who verbally acknowledged these results. Free-fluid in the pelvis. 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 Scoliosis and postoperative changes in the lumbar spine. Indeterminate subcentimeter liver hypodensities. If there is risk or history of malignancy, consider follow-up MRI with contrast. Electronically Signed   By: Jolaine Click M.D.   On: 02/28/2018 21:26   Dg Sinus/fist Tube Chk-non Gi  Result Date: 03/20/2018 CLINICAL DATA:  Appendicitis with abscess EXAM: ABSCESS DRAIN CATHETER INJECTION UNDER FLUOROSCOPY TECHNIQUE: The procedure, risks (including but not limited to bleeding, infection, organ damage), benefits, and alternatives were explained to the patient. Questions regarding the procedure were encouraged and answered. The patient understands and consents to the procedure. Survey fluoroscopic inspection reveals stable position of left trans gluteal drain catheter. Residual contrast material in the nondilated urinary bladder and distal ureters. Injection demonstrates no significant abscess cavity around the pigtail portion of the drain catheter. Contrast tracks back along the shaft of the catheter into a cul-de-sac collection which is incompletely opacified. IMPRESSION: 1. No significant residual abscess cavity around the pigtail portion of the drain catheter. There is residual extraluminal collection along the shaft of the catheter. The catheter could not be repositioned easily under fluoroscopy. The patient will be scheduled for catheter exchange and repositioning into this dominant residual component. Electronically Signed   By: Corlis Leak M.D.   On: 03/20/2018 16:45   Ct Image Guided Fluid Drain By Catheter  Result Date: 03/02/2018 INDICATION: 32 year old female with ruptured appendicitis. The presumed abscess adjacent to the ruptured appendix is non  accessible via percutaneous method. She has been referred for drainage of pelvic fluid collection with trans gluteal drain. EXAM: CT-GUIDED DRAINAGE MEDICATIONS: The patient is currently admitted to the hospital and receiving intravenous antibiotics. The antibiotics were administered within an appropriate time frame prior to the initiation of the procedure. ANESTHESIA/SEDATION: Fentanyl 100 mcg IV; Versed 2.0 mg IV Moderate Sedation Time:  34 minutes The patient was continuously monitored during the procedure by the interventional radiology nurse under my direct supervision. COMPLICATIONS: None PROCEDURE: Informed written consent was obtained from the patient after a thorough discussion of the procedural risks, benefits and alternatives. All questions were addressed. Maximal Sterile Barrier Technique was utilized including caps, mask, sterile gowns, sterile gloves, sterile drape, hand hygiene and skin antiseptic. A timeout was performed prior to the initiation of the procedure. Patient position prone on the CT table. Images of the pelvis were performed with images stored and sent to PACs. Once the patient is prepped and draped in usual sterile fashion. 1% lidocaine was used for local anesthesia. Small stab incision was made. Trocar needle was placed in the pelvic fluid collection. Using modified Seldinger technique, 10 French drain was placed into the pelvic fluid collection. Aspirate was achieved with approximately 40 cc of serosanguineous fluid. Sample sent for culture. Patient tolerated  the procedure well and remained hemodynamically stable throughout. No complications were encountered and no significant blood loss. IMPRESSION: CT-guided placement of left trans gluteal drain for pelvic fluid collection. Sample was sent for a culture. Signed, Yvone Neu. Reyne Dumas, RPVI [facility] Radiology Electronically Signed   By: Gilmer Mor D.O.   On: 03/02/2018 08:43   Ir  Radiologist Eval & Mgmt  Result Date: 03/20/2018 Please refer to notes tab for details about interventional procedure. (Op Note)   Labs:  CBC: Recent Labs    03/07/18 0951 03/08/18 0426 03/09/18 0516 03/10/18 0527  WBC 16.4* 17.0* 16.7* 11.5*  HGB 10.2* 9.4* 10.9* 10.9*  HCT 31.8* 28.6* 33.3* 33.4*  PLT 405* 371 455* 523*    COAGS: Recent Labs    03/01/18 1023  INR 1.20    BMP: Recent Labs    03/06/18 0535 03/07/18 0951 03/09/18 0516 03/10/18 0527  NA 143 143 138 140  K 3.2* 3.3* 3.9 4.6  CL 107 107 101 102  CO2 26 28 26 25   GLUCOSE 111* 126* 104* 102*  BUN <5* 7 7 13   CALCIUM 8.3* 8.8* 8.9 9.4  CREATININE 0.63 0.67 0.60 0.64  GFRNONAA >60 >60 >60 >60  GFRAA >60 >60 >60 >60    LIVER FUNCTION TESTS: Recent Labs    02/28/18 1435  BILITOT 0.7  AST 18  ALT 13  ALKPHOS 88  PROT 7.0  ALBUMIN 3.6    TUMOR MARKERS: No results for input(s): AFPTM, CEA, CA199, CHROMGRNA in the last 8760 hours.  Assessment and Plan: Patient with past medical history of perforated appendix presents with complaint of persistent fluid collections not adequately drained.  She presents to [facility] radiology for drain repositioning with sedation.  Patient presents today in their usual state of health.  She has been NPO and is not currently on blood thinners.   Risks and benefits discussed with the patient including bleeding, infection, damage to adjacent structures, bowel perforation/fistula connection, and sepsis.  All of the patient's questions were answered, patient is agreeable to proceed. Consent signed and in chart.  Thank you for this interesting consult.  I greatly enjoyed meeting Lindsey Tucker and look forward to participating in their care.  A copy of this report was sent to the requesting provider on this date.  Electronically Signed: Hoyt Koch 03/22/2018, 9:43 AM   I spent a total of 20 minutes in face to face in clinical consultation, greater than  50% of which was counseling/coordinating care for pelvic abscess.

## 2018-03-22 NOTE — Procedures (Signed)
Pelvic abscess drain exchange 10 Fr EBL 0 Comp 0

## 2018-03-22 NOTE — Discharge Instructions (Signed)
Moderate Conscious Sedation, Adult, Care After °These instructions provide you with information about caring for yourself after your procedure. Your health care provider may also give you more specific instructions. Your treatment has been planned according to current medical practices, but problems sometimes occur. Call your health care provider if you have any problems or questions after your procedure. °What can I expect after the procedure? °After your procedure, it is common: °· To feel sleepy for several hours. °· To feel clumsy and have poor balance for several hours. °· To have poor judgment for several hours. °· To vomit if you eat too soon. ° °Follow these instructions at home: °For at least 24 hours after the procedure: ° °· Do not: °? Participate in activities where you could fall or become injured. °? Drive. °? Use heavy machinery. °? Drink alcohol. °? Take sleeping pills or medicines that cause drowsiness. °? Make important decisions or sign legal documents. °? Take care of children on your own. °· Rest. °Eating and drinking °· Follow the diet recommended by your health care provider. °· If you vomit: °? Drink water, juice, or soup when you can drink without vomiting. °? Make sure you have little or no nausea before eating solid foods. °General instructions °· Have a responsible adult stay with you until you are awake and alert. °· Take over-the-counter and prescription medicines only as told by your health care provider. °· If you smoke, do not smoke without supervision. °· Keep all follow-up visits as told by your health care provider. This is important. °Contact a health care provider if: °· You keep feeling nauseous or you keep vomiting. °· You feel light-headed. °· You develop a rash. °· You have a fever. °Get help right away if: °· You have trouble breathing. °This information is not intended to replace advice given to you by your health care provider. Make sure you discuss any questions you have  with your health care provider. °Document Released: 03/27/2013 Document Revised: 11/09/2015 Document Reviewed: 09/26/2015 °Elsevier Interactive Patient Education © 2018 [employer] °Surgical Drain Home Care °Surgical drains are used to remove extra fluid that normally builds up in a surgical wound after surgery. A surgical drain helps to heal a surgical wound. Different kinds of surgical drains include: °· Active drains. These drains use suction to pull drainage away from the surgical wound. Drainage flows through a tube to a container outside of the body. It is important to keep the bulb or the drainage container flat (compressed) at all times, except while you empty it. Flattening the bulb or container creates suction. The two most common types of active drains are bulb drains and Hemovac drains. °· Passive drains. These drains allow fluid to drain naturally, by gravity. Drainage flows through a tube to a bandage (dressing) or a container outside of the body. Passive drains do not need to be emptied. The most common type of passive drain is the Penrose drain. ° °A drain is placed during surgery. Immediately after surgery, drainage is usually bright red and a little thicker than water. The drainage may gradually turn yellow or pink and become thinner. It is likely that your health care provider will remove the drain when the drainage stops or when the amount decreases to 1-2 Tbsp (15-30 mL) during a 24-hour period. °How to care for your surgical drain °· Keep the skin around the drain dry and covered with a dressing at all times. °· Check your drain area every day for signs   of infection. Check for: °? More redness, swelling, or pain. °? Pus or a bad smell. °? Cloudy drainage. °Follow instructions from your health care provider about how to take care of your drain and how to change your dressing. Change your dressing at least one time every day. Change it more often if needed to keep the dressing dry. Make sure  you: °1. Gather your supplies, including: °? Tape. °? Germ-free cleaning solution (sterile saline). °? Split gauze drain sponge: 4 x 4 inches (10 x 10 cm). °? Gauze square: 4 x 4 inches (10 x 10 cm). °2. Wash your hands with soap and water before you change your dressing. If soap and water are not available, use hand sanitizer. °3. Remove the old dressing. Avoid using scissors to do that. °4. Use sterile saline to clean your skin around the drain. °5. Place the tube through the slit in a drain sponge. Place the drain sponge so that it covers your wound. °6. Place the gauze square or another drain sponge on top of the drain sponge that is on the wound. Make sure the tube is between those layers. °7. Tape the dressing to your skin. °8. If you have an active bulb or Hemovac drain, tape the drainage tube to your skin 1-2 inches (2.5-5 cm) below the place where the tube enters your body. Taping keeps the tube from pulling on any stitches (sutures) that you have. °9. Wash your hands with soap and water. °10. Write down the color of your drainage and how often you change your dressing. ° °How to empty your active bulb or Hemovac drain °1. Make sure that you have a measuring cup that you can empty your drainage into. °2. Wash your hands with soap and water. If soap and water are not available, use hand sanitizer. °3. Gently move your fingers down the tube while squeezing very lightly. This is called stripping the tube. This clears any drainage, clots, or tissue from the tube. °? Do not pull on the tube. °? You may need to strip the tube several times every day to keep the tube clear. °4. Open the bulb cap or the drain plug. Do not touch the inside of the cap or the bottom of the plug. °5. Empty all of the drainage into the measuring cup. °6. Compress the bulb or the container and replace the cap or the plug. To compress the bulb or the container, squeeze it firmly in the middle while you close the cap or plug the  container. °7. Write down the amount of drainage that you have in each 24-hour period. If you have less than 2 Tbsp (30 mL) of drainage during 24 hours, contact your health care provider. °8. Flush the drainage down the toilet. °9. Wash your hands with soap and water. °Contact a health care provider if: °· You have more redness, swelling, or pain around your drain area. °· The amount of drainage that you have is increasing instead of decreasing. °· You have pus or a bad smell coming from your drain area. °· You have a fever. °· You have drainage that is cloudy. °· There is a sudden stop or a sudden decrease in the amount of drainage that you have. °· Your tube falls out. °· Your active drain does not stay compressed after you empty it. °This information is not intended to replace advice given to you by your health care provider. Make sure you discuss any questions you have with your health care provider. °  Document Released: 06/03/2000 Document Revised: 11/12/2015 Document Reviewed: 12/24/2014 °Elsevier Interactive Patient Education © 2018 Elsevier Inc. ° °

## 2018-03-28 ENCOUNTER — Other Ambulatory Visit: Payer: Self-pay | Admitting: Surgery

## 2018-03-28 DIAGNOSIS — R188 Other ascites: Secondary | ICD-10-CM

## 2018-03-28 DIAGNOSIS — K3533 Acute appendicitis with perforation and localized peritonitis, with abscess: Secondary | ICD-10-CM

## 2018-04-04 ENCOUNTER — Ambulatory Visit
Admission: RE | Admit: 2018-04-04 | Discharge: 2018-04-04 | Disposition: A | Discharge status: Home / Self Care | Payer: 59 | Source: Ambulatory Visit | Attending: Surgery | Admitting: Surgery

## 2018-04-04 ENCOUNTER — Encounter: Payer: Self-pay | Admitting: *Deleted

## 2018-04-04 DIAGNOSIS — K3533 Acute appendicitis with perforation and localized peritonitis, with abscess: Secondary | ICD-10-CM

## 2018-04-04 HISTORY — PX: IR RADIOLOGIST EVAL & MGMT: IMG5224

## 2018-04-04 MED ORDER — IOPAMIDOL (ISOVUE-300) INJECTION 61%
80.0000 mL | Freq: Once | INTRAVENOUS | Status: AC | PRN
  Administered 2018-04-04: 80 mL via INTRAVENOUS

## 2018-04-04 NOTE — Progress Notes (Signed)
Chief Complaint: Patient was seen in consultation today for follow up pelvic abscess drain  Referring Physician(s): Dr. Harriette Bouillon  History of Present Illness: Lindsey Tucker is a 32 y.o. female who was admitted with appendicitis and adjacent abscess formation. She underwent transgluteal drain placement 03/02/18 with replacement and adjustment 03/22/18 for residual, unresolved collection.  She returns to IR clinic for repeat imaging and drain evaluation.  She has been getting minimal amounts of serous output from her drain over the past several days.  Denies fevers, chills, abdominal pain, nausea.  She has continued her regular follow-up with Dr. Luisa Hart as well. Not currently on antibiotics.   Past Medical History:  Diagnosis Date  . Asthma   . GERD (gastroesophageal reflux disease)   . Postpartum care following cesarean delivery (8/14) 02/02/2016  . Scoliosis   . Vacuum extractor delivery, delivered (12/12) 06/01/2013    Past Surgical History:  Procedure Laterality Date  . CESAREAN SECTION N/A 02/01/2016   Procedure: Primary CESAREAN SECTION;  Surgeon: Noland Fordyce, MD;  Location: [facility] BIRTHING SU[facility];  Service: Obstetrics;  Laterality: N/A;  EDD: 02/08/16 *Time ok per Izora Ribas*  . IR CATHETER TUBE CHANGE  03/22/2018  . IR RADIOLOGIST EVAL & MGMT  03/20/2018  . LAPAROSCOPIC ABDOMINAL EXPLORATION N/A 03/06/2018   Procedure: LAPAROSCOPIC PELVIC ABSCESS DRAINAGE;  Surgeon: Harriette Bouillon, MD;  Location: [facility] OR;  Service: General;  Laterality: N/A;  . SPINAL GROWTH RODS  2000, 2003  . WISDOM TOOTH EXTRACTION      Allergies: Morphine and related  Medications: Prior to Admission medications   Medication Sig Start Date End Date Taking? Authorizing Provider  amoxicillin-clavulanate (AUGMENTIN) 875-125 MG tablet Take 1 tablet by mouth every 12 (twelve) hours. 03/10/18   Chevis Pretty III, MD  fluconazole (DIFLUCAN) 200 MG tablet Take 1 tablet (200 mg total) by mouth daily. 03/10/18    Griselda Miner, MD  ibuprofen (ADVIL,MOTRIN) 800 MG tablet Take 1 tablet (800 mg total) by mouth every 8 (eight) hours as needed for mild pain, moderate pain or cramping. Patient not taking: Reported on 02/28/2018 02/04/16   [name], CNM  loratadine (CLARITIN) 10 MG tablet Take 10 mg by mouth daily as needed for allergies.     [provider]  naproxen sodium (ALEVE) 220 MG tablet Take 220 mg by mouth 2 (two) times daily as needed (pain).    [provider]  oxyCODONE-acetaminophen (PERCOCET/ROXICET) 5-325 MG tablet Take 1 tablet by mouth every 6 (six) hours as needed for moderate pain or severe pain. 03/10/18   Griselda Miner, MD     Family History  Problem Relation Age of Onset  . Hypertension Mother   . Hypertension Father   . Diabetes Maternal Grandfather   . Cancer Maternal Grandfather        prostate  . Cancer Paternal Grandmother        breast    Social History   Socioeconomic History  . Marital status: Married    Spouse name: Not on file  . Number of children: Not on file  . Years of education: Not on file  . Highest education level: Not on file  Occupational History  . Not on file  Social Needs  . Financial resource strain: Not on file  . Food insecurity:    Worry: Not on file    Inability: Not on file  . Transportation needs:    Medical: Not on file    Non-medical: Not on file  Tobacco Use  . Smoking status: Never Smoker  . Smokeless tobacco: Never Used  Substance and Sexual Activity  . Alcohol use: Yes    Alcohol/week: 2.0 standard drinks    Types: 2 Shots of liquor per week  . Drug use: No  . Sexual activity: Yes    Birth control/protection: Pill  Lifestyle  . Physical activity:    Days per week: Not on file    Minutes per session: Not on file  . Stress: Not on file  Relationships  . Social connections:    Talks on phone: Not on file    Gets together: Not on file    Attends religious service: Not on file    Active member of  club or organization: Not on file    Attends meetings of clubs or organizations: Not on file    Relationship status: Not on file  Other Topics Concern  . Not on file  Social History Narrative  . Not on file    Vital Signs: There were no vitals taken for this visit.  Physical Exam NAD, alert Abdomen: Drain intact. Insertion site c/d/i.  Minimal amount of serous fluid in bulb.    Imaging: Ct Abdomen Pelvis W Contrast  Result Date: 03/20/2018 CLINICAL DATA:  Ruptured appendicitis with abscess x 2Drains places 9/12 and 9/17Hx c-section and laproscopic to clean abscessNo caPrior in pacsFlushing 2 times a dayBack drain - draining 30ml or less dailyFront drain - draining 30-35ml dailyCurrently on abx EXAM: CT ABDOMEN AND PELVIS WITH CONTRAST TECHNIQUE: Multidetector CT imaging of the abdomen and pelvis was performed using the standard protocol following bolus administration of intravenous contrast. CONTRAST:  ISOVUE-300 IOPAMIDOL (ISOVUE-300) INJECTION 61% COMPARISON:  03/05/2018 FINDINGS: Lower chest: No pleural or pericardial effusion. Bilateral breast prostheses. Visualized lung bases clear. Hepatobiliary: Stable subcentimeter probable cysts in hepatic segments 2 and 4B. New 3.1 cm low-attenuation subcapsular process in segment 6/7, containing a 9 mm hyperdense or enhancing focus. No biliary ductal dilatation. Gallbladder is nondistended. Pancreas: Unremarkable. No pancreatic ductal dilatation or surrounding inflammatory changes. Spleen: Normal in size without focal abnormality. Adrenals/Urinary Tract: Normal adrenal glands. Normal kidneys. Urinary bladder is nondistended. Stomach/Bowel: Stomach is nondilated. Small bowel decompressed. Appendix not discretely identified. Moderate fecal material in the nondilated cecum. The colon is decompressed. Vascular/Lymphatic: No significant vascular findings are present. No enlarged abdominal or pelvic lymph nodes. Reproductive: Uterus and bilateral adnexa  are unremarkable. Other: Surgical drain from right lower quadrant approach around the periphery of the peritoneal cavity anteriorly, tip in the right anterior pelvis. Left transgluteal pigtail drain catheter extends across the midline into cul-de-sac collection which is significantly decompressed with a small amount of residual enhancing fluid bilaterally, measured up to 3.9 cm axial diameter. Musculoskeletal: Thoracolumbar levoscoliosis with posterior fixation and fusion hardware extending down to L4 posterior elements. No fracture or worrisome bone lesion. IMPRESSION: 1. Significant interval improvement in cul-de-sac abscess post percutaneous drainage. There is a small amount of residual enhancing fluid posteriorly. 2. Resolution of right lower quadrant anterior peritoneal abscess with placement surgical drain. 3. New 3.1 cm subcapsular hepatic lesion in the posterior right right lobe, suspect abscess. Electronically Signed   By: Corlis Leak M.D.   On: 03/20/2018 16:44   Ir Catheter Tube Change  Result Date: 03/22/2018 INDICATION: Drain exchange EXAM: PELVIC DRAIN EXCHANGE MEDICATIONS: The patient is currently admitted to the hospital and receiving intravenous antibiotics. The antibiotics were administered within an appropriate time frame prior to the initiation of the procedure.  ANESTHESIA/SEDATION: Fentanyl 100 mcg IV; Versed 2 mg IV Moderate Sedation Time:  10 minutes The patient was continuously monitored during the procedure by the interventional radiology nurse under my direct supervision. COMPLICATIONS: None immediate. PROCEDURE: Informed written consent was obtained from the patient after a thorough discussion of the procedural risks, benefits and alternatives. All questions were addressed. Maximal Sterile Barrier Technique was utilized including caps, mask, sterile gowns, sterile gloves, sterile drape, hand hygiene and skin antiseptic. A timeout was performed prior to the initiation of the procedure.  Left gluteal region was prepped and draped in a sterile fashion. 1% lidocaine was utilized for local anesthesia. The existing drain was cut and exchanged over a Bentson wire for a Kumpe catheter. The Kumpe catheter was utilized to advance the Bentson into the left pelvic residual cavity. It was then exchanged over the Bentson wire for a new 10 [place] drain. It was looped and string fixed within the left side of the pelvis. Contrast was injected. FINDINGS: Imaging demonstrates exchange of the pelvic abscess drain catheter. It is now positioned in the residual left pelvic cavity. IMPRESSION: Successful repositioning and exchange of the pelvic drain. Electronically Signed   By: Jolaine Click M.D.   On: 03/22/2018 10:27   Dg Sinus/fist Tube Chk-non Gi  Result Date: 03/20/2018 CLINICAL DATA:  Appendicitis with abscess EXAM: ABSCESS DRAIN CATHETER INJECTION UNDER FLUOROSCOPY TECHNIQUE: The procedure, risks (including but not limited to bleeding, infection, organ damage), benefits, and alternatives were explained to the patient. Questions regarding the procedure were encouraged and answered. The patient understands and consents to the procedure. Survey fluoroscopic inspection reveals stable position of left trans gluteal drain catheter. Residual contrast material in the nondilated urinary bladder and distal ureters. Injection demonstrates no significant abscess cavity around the pigtail portion of the drain catheter. Contrast tracks back along the shaft of the catheter into a cul-de-sac collection which is incompletely opacified. IMPRESSION: 1. No significant residual abscess cavity around the pigtail portion of the drain catheter. There is residual extraluminal collection along the shaft of the catheter. The catheter could not be repositioned easily under fluoroscopy. The patient will be scheduled for catheter exchange and repositioning into this dominant residual component. Electronically Signed   By: Corlis Leak M.D.    On: 03/20/2018 16:45   Ir Radiologist Eval & Mgmt  Result Date: 03/20/2018 Please refer to notes tab for details about interventional procedure. (Op Note)   Labs:  CBC: Recent Labs    03/07/18 0951 03/08/18 0426 03/09/18 0516 03/10/18 0527  WBC 16.4* 17.0* 16.7* 11.5*  HGB 10.2* 9.4* 10.9* 10.9*  HCT 31.8* 28.6* 33.3* 33.4*  PLT 405* 371 455* 523*    COAGS: Recent Labs    03/01/18 1023  INR 1.20    BMP: Recent Labs    03/06/18 0535 03/07/18 0951 03/09/18 0516 03/10/18 0527  NA 143 143 138 140  K 3.2* 3.3* 3.9 4.6  CL 107 107 101 102  CO2 26 28 26 25   GLUCOSE 111* 126* 104* 102*  BUN <5* 7 7 13   CALCIUM 8.3* 8.8* 8.9 9.4  CREATININE 0.63 0.67 0.60 0.64  GFRNONAA >60 >60 >60 >60  GFRAA >60 >60 >60 >60    LIVER FUNCTION TESTS: Recent Labs    02/28/18 1435  BILITOT 0.7  AST 18  ALT 13  ALKPHOS 88  PROT 7.0  ALBUMIN 3.6    TUMOR MARKERS: No results for input(s): AFPTM, CEA, CA199, CHROMGRNA in the last 8760 hours.  Assessment and  Plan: Ruptured appendicits with abscess and pelvic fluid collection s/p TG drain placement 03/02/18. Patient underwent drain exchange 03/22/18 and returns to clinic today for follow-up imaging and evaluation.  She states the output from her drain has continued to decrease.  Now output is trace, serous-appearing.  CT Abdomen/Pelvis reviewed today by Dr. Fredia Sorrow who notes essential resolve of fluid collection surrounding drain.  A small amount of dependent free fluid remains.  Drain injection performed 03/22/18 showed no fistula and no concerns regarding fistula noted today based on appearance of output. Recommends drain removal.  PA removed drain in its entirety without complication. Dressing placed. Patient does not need further follow-up with IR at this time. She is encouraged to keep her upcoming appointments with Dr. Luisa Hart and surgical team.   Thank you for this interesting consult.  I greatly enjoyed meeting Lindsey Tucker and look forward to participating in their care.  A copy of this report was sent to the requesting provider on this date.  Electronically Signed: Hoyt Koch 04/04/2018, 2:50 PM   I spent a total of 20 minutes in face to face in clinical consultation, greater than 50% of which was counseling/coordinating care for pelvic abscess drain care

## 2018-04-10 ENCOUNTER — Ambulatory Visit: Payer: Self-pay | Admitting: Surgery

## 2018-04-10 NOTE — H&P (Signed)
Lindsey Tucker Documented: 04/10/2018 10:16 AM Location: [facility] Patient #: [id number] DOB: Oct 28, 1985 Married / Language: [name] / Race: [name]  History of Present Illness Lindsey Fus A. Meshach Perry MD; 04/10/2018 11:04 AM) Patient words: Patient returns for follow-up of her intra-abdominal abscess. Both for percutaneous drainage removed. On her most recent CT there is a fluid collection along the inferior upper border of the right lobe of the liver consistent with abscess and probable migrated appendicolith. White count 11,000. She has had low-grade fevers but is able to function taking care of her children. She denies any severe abdominal pain no. Specifically, she denies any right upper quadrant pain or back pain. She does have some left-sided discomfort she states. No dysuria or polyuria.  The patient is a 32 year old female.   Allergies [facility], RMA; 04/10/2018 10:17 AM) Morphine Derivatives Allergies Reconciled  Medication History (Lindsey Tucker, RMA; 04/10/2018 10:17 AM) Fluconazole (200MG  Tablet, Oral) Active. Ibuprofen (800MG  Tablet, Oral) Active. Claritin (10MG  Capsule, Oral) Active. Medications Reconciled    Vitals (Lindsey Tucker RMA; 04/10/2018 10:18 AM) 04/10/2018 10:18 AM Weight: 85.4 lb Height: 60in Body Surface Area: 1.3 m Body Mass Index: 16.68 kg/m  Pain Level: 0/10 Temp.: 23F(Temporal)  Pulse: 113 (Regular)  P.OX: 99% (Room air) BP: 100/68 (Sitting, Left Arm, Standard)      Physical Exam (Lindsey Poster A. Ardis Fullwood MD; 04/10/2018 11:05 AM)  General Mental Status-Alert. General Appearance-Consistent with stated age. Hydration-Well hydrated. Voice-Normal.  Head and Neck Head-normocephalic, atraumatic with no lesions or palpable masses.  Chest and Lung Exam Chest and lung exam reveals -quiet, even and easy respiratory effort with no use of accessory muscles and on auscultation,  normal breath sounds, no adventitious sounds and normal vocal resonance. Inspection Chest Wall - Normal. Back - normal.  Cardiovascular Cardiovascular examination reveals -on palpation PMI is normal in location and amplitude, no palpable S3 or S4. Normal cardiac borders., normal heart sounds, regular rate and rhythm with no murmurs, carotid auscultation reveals no bruits and normal pedal pulses bilaterally.  Abdomen Note: Soft nontender well-healed laparoscopic sites. No rebound or guarding.    Assessment & Plan (Lindsey Sahagian A. Shamon Cothran MD; 04/10/2018 11:06 AM)  POST-OPERATIVE STATE [phone]) Impression: Right upper quadrant abscess. There is appendicolith looks like on CT located within this measuring 3 cm. I discussed percutaneous versus operative versus observation. She still has low-grade fever with a minimally elevated white count therefore I do not think observation is appropriate in this setting. There is an appendicolith as well looks like in this could be a nidus for infection therefore removal may be best for her. Its in a very difficult location laparotomy may be required in this circumstance. I recommended initial laparoscopic approach with hand assist if needed trying to reserve laparotomy as a last option. I'll discuss all this with her today but I do not feel she will get back to baseline without intervention. I do not feel perfectly strange would be helpful since he appendicolith would be hard to remove and washout. Risk of bleeding, infection, open surgery, bowel injury, bowel resection, liver injury, gallbladder injury, diaphragmatic injury, need further procedures and/or treatments, prolonged hospitalization, DVT, cardiovascular risks, and expected recovery discussed with the patient.  Current Plans Started Augmentin 500-125 MG Oral Tablet, 1 (one) Tablet two times daily, #14, 04/10/2018, No Refill. Pt Education - [facility] Education - Written Instructions given: discussed with patient  and provided information. Pt Education - [facility] Laparoscopic Surgery [facility] The anatomy & physiology of the digestive  tract was discussed. The pathophysiology of intestinal obstruction was discussed. Natural history risks without surgery was discussed. Differential diagnosis discuseed. I feel the patient has failed non-operative therapies. The risks of no intervention will lead to serious problems such as necrosis, perforation, dehydration, etc. that outweigh the operative risks; therefore, I recommended abdominal exploration to diagnose & treat the source of the problem. Minimally Invasive & open techniques were discussed. I expressed a good likelihood that surgery will treat the problem.  Risks such as bleeding, infection, abscess, leak, reoperation, bowel resection, possible ostomy, hernia, heart attack, death, and other risks were discussed. I noted a good likelihood this will help address the problem. Goals of post-operative recovery were discussed as well. We will work to minimize complications. Questions were answered. The patient expresses understanding & wishes to proceed with surgery.

## 2018-04-10 NOTE — H&P (View-Only) (Signed)
[name] Documented: 04/10/2018 10:16 AM Location: [facility] Patient #: [id number] DOB: [birth year] Married / Language: [name] / Race: [name]  History of Present Illness Lindsey Tucker A. Amrita Radu MD; 04/10/2018 11:04 AM) Patient words: Patient returns for follow-up of her intra-abdominal abscess. Both for percutaneous drainage removed. On her most recent CT there is a fluid collection along the inferior upper border of the right lobe of the liver consistent with abscess and probable migrated appendicolith. White count 11,000. She has had low-grade fevers but is able to function taking care of her children. She denies any severe abdominal pain no. Specifically, she denies any right upper quadrant pain or back pain. She does have some left-sided discomfort she states. No dysuria or polyuria.  The patient is a 32 year old female.   Allergies [facility], RMA; 04/10/2018 10:17 AM) Morphine Derivatives Allergies Reconciled  Medication History (Jacqueline Haggett, RMA; 04/10/2018 10:17 AM) Fluconazole (200MG  Tablet, Oral) Active. Ibuprofen (800MG  Tablet, Oral) Active. Claritin (10MG  Capsule, Oral) Active. Medications Reconciled    Vitals (Jacqueline Haggett RMA; 04/10/2018 10:18 AM) 04/10/2018 10:18 AM Weight: 85.4 lb Height: 60in Body Surface Area: 1.3 m Body Mass Index: 16.68 kg/m  Pain Level: 0/10 Temp.: 64F(Temporal)  Pulse: 113 (Regular)  P.OX: 99% (Room air) BP: 100/68 (Sitting, Left Arm, Standard)      Physical Exam (Noretta Frier A. Printice Hellmer MD; 04/10/2018 11:05 AM)  General Mental Status-Alert. General Appearance-Consistent with stated age. Hydration-Well hydrated. Voice-Normal.  Head and Neck Head-normocephalic, atraumatic with no lesions or palpable masses.  Chest and Lung Exam Chest and lung exam reveals -quiet, even and easy respiratory effort with no use of accessory muscles and on auscultation,  normal breath sounds, no adventitious sounds and normal vocal resonance. Inspection Chest Wall - Normal. Back - normal.  Cardiovascular Cardiovascular examination reveals -on palpation PMI is normal in location and amplitude, no palpable S3 or S4. Normal cardiac borders., normal heart sounds, regular rate and rhythm with no murmurs, carotid auscultation reveals no bruits and normal pedal pulses bilaterally.  Abdomen Note: Soft nontender well-healed laparoscopic sites. No rebound or guarding.    Assessment & Plan (Polk Minor A. Megahn Killings MD; 04/10/2018 11:06 AM)  POST-OPERATIVE STATE [phone]) Impression: Right upper quadrant abscess. There is appendicolith looks like on CT located within this measuring 3 cm. I discussed percutaneous versus operative versus observation. She still has low-grade fever with a minimally elevated white count therefore I do not think observation is appropriate in this setting. There is an appendicolith as well looks like in this could be a nidus for infection therefore removal may be best for her. Its in a very difficult location laparotomy may be required in this circumstance. I recommended initial laparoscopic approach with hand assist if needed trying to reserve laparotomy as a last option. I'll discuss all this with her today but I do not feel she will get back to baseline without intervention. I do not feel perfectly strange would be helpful since he appendicolith would be hard to remove and washout. Risk of bleeding, infection, open surgery, bowel injury, bowel resection, liver injury, gallbladder injury, diaphragmatic injury, need further procedures and/or treatments, prolonged hospitalization, DVT, cardiovascular risks, and expected recovery discussed with the patient.  Current Plans Started Augmentin 500-125 MG Oral Tablet, 1 (one) Tablet two times daily, #14, 04/10/2018, No Refill. Pt Education - [facility] Education - Written Instructions given: discussed with patient  and provided information. Pt Education - [facility] Laparoscopic Surgery HCI The anatomy & physiology of the digestive  tract was discussed. The pathophysiology of intestinal obstruction was discussed. Natural history risks without surgery was discussed. Differential diagnosis discuseed. I feel the patient has failed non-operative therapies. The risks of no intervention will lead to serious problems such as necrosis, perforation, dehydration, etc. that outweigh the operative risks; therefore, I recommended abdominal exploration to diagnose & treat the source of the problem. Minimally Invasive & open techniques were discussed. I expressed a good likelihood that surgery will treat the problem.  Risks such as bleeding, infection, abscess, leak, reoperation, bowel resection, possible ostomy, hernia, heart attack, death, and other risks were discussed. I noted a good likelihood this will help address the problem. Goals of post-operative recovery were discussed as well. We will work to minimize complications. Questions were answered. The patient expresses understanding & wishes to proceed with surgery.

## 2018-04-12 NOTE — Pre-Procedure Instructions (Signed)
Lindsey Tucker  04/12/2018     Your procedure is scheduled on .Tuesday, October 29  Report to [facility] Admitting at 5:30 AM                     Your surgery or procedure is scheduled for  7:30 A.M.   Call this number if you have problems the morning of surgery: [phone]  This is the number for the Pre- Surgical Desk.   Remember:  Do not eat or drink after midnight Monday, October 28.    Take these medicines the morning of surgery with A SIP OF WATER: May take if needed: loratadine (CLARITIN) oxyCODONE-acetaminophen (PERCOCET/ROXICET)   STOP taking Aspirin, Aspirin Products (Goody Powder, Excedrin Migraine), Ibuprofen (Advil), Naproxen (Aleve), Vitamins and Herbal Products (ie Fish Oil)   [place]- Preparing For Surgery  Before surgery, you can play an important role. Because skin is not sterile, your skin needs to be as free of germs as possible. You can reduce the number of germs on your skin by washing with CHG (chlorahexidine gluconate) Soap before surgery.  CHG is an antiseptic cleaner which kills germs and bonds with the skin to continue killing germs even after washing.    Oral Hygiene is also important to reduce your risk of infection.  Remember - BRUSH YOUR TEETH THE MORNING OF SURGERY WITH YOUR REGULAR TOOTHPASTE  Please do not use if you have an allergy to CHG or antibacterial soaps. If your skin becomes reddened/irritated stop using the CHG.  Do not shave (including legs and underarms) for at least 48 hours prior to first CHG shower. It is OK to shave your face.  Please follow these instructions carefully.   1. Shower the NIGHT BEFORE SURGERY and the MORNING OF SURGERY with CHG.   2. If you chose to wash your hair, wash your hair first as usual with your normal shampoo.  3. After you shampoo, rinse your hair and body thoroughly to remove the shampoo.  4. Use CHG as you would any other liquid soap. You can apply CHG directly to the skin and  wash gently with a scrungie or a clean washcloth.   5. Apply the CHG Soap to your body ONLY FROM THE NECK DOWN.  Do not use on open wounds or open sores. Avoid contact with your eyes, ears, mouth and genitals (private parts). Wash Face and genitals (private parts)  with your normal soap.  6. Wash thoroughly, paying special attention to the area where your surgery will be performed.  7. Thoroughly rinse your body with warm water from the neck down.  8. DO NOT shower/wash with your normal soap after using and rinsing off the CHG Soap.  9. Pat yourself dry with a CLEAN TOWEL.  10. Wear CLEAN PAJAMAS to bed the night before surgery, wear comfortable clothes the morning of surgery  11. Place CLEAN SHEETS on your bed the night of your first shower and DO NOT SLEEP WITH PETS.   Day of Surgery: Shower as above Do not apply any deodorants/lotions, powders or colognes.  Please wear clean clothes to the hospital/surgery center.   Remember to brush your teeth WITH YOUR REGULAR TOOTHPASTE.  Do not wear jewelry, make-up or nail polish.  Do not shave 48 hours prior to surgery.  .  Do not bring valuables to the hospital.  [facility] is not responsible for any belongings or valuables.  Contacts, dentures or bridgework may not  be worn into surgery.  Leave your suitcase in the car.  After surgery it may be brought to your room.  For patients admitted to the hospital, discharge time will be determined by your treatment team.  Patients discharged the day of surgery will not be allowed to drive home.   Please read over the following fact sheets that you were given.

## 2018-04-13 ENCOUNTER — Encounter (HOSPITAL_COMMUNITY): Payer: Self-pay | Admitting: *Deleted

## 2018-04-13 ENCOUNTER — Other Ambulatory Visit: Payer: Self-pay

## 2018-04-13 ENCOUNTER — Encounter (HOSPITAL_COMMUNITY)
Admission: RE | Admit: 2018-04-13 | Discharge: 2018-04-13 | Disposition: A | Discharge status: Home / Self Care | Payer: 59 | Source: Ambulatory Visit | Attending: Surgery | Admitting: Surgery

## 2018-04-13 DIAGNOSIS — Z01812 Encounter for preprocedural laboratory examination: Secondary | ICD-10-CM | POA: Diagnosis present

## 2018-04-13 HISTORY — DX: Major depressive disorder, single episode, unspecified: F32.9

## 2018-04-13 HISTORY — DX: Depression, unspecified: F32.A

## 2018-04-13 HISTORY — DX: Anemia, unspecified: D64.9

## 2018-04-13 LAB — CBC WITH DIFFERENTIAL/PLATELET
ABS IMMATURE GRANULOCYTES: 0.03 10*3/uL (ref 0.00–0.07)
Basophils Absolute: 0.1 10*3/uL (ref 0.0–0.1)
Basophils Relative: 1 %
EOS PCT: 1 %
Eosinophils Absolute: 0.1 10*3/uL (ref 0.0–0.5)
HCT: 34.1 % — ABNORMAL LOW (ref 36.0–46.0)
HEMOGLOBIN: 10.4 g/dL — AB (ref 12.0–15.0)
Immature Granulocytes: 0 %
LYMPHS PCT: 17 %
Lymphs Abs: 1.6 10*3/uL (ref 0.7–4.0)
MCH: 26.8 pg (ref 26.0–34.0)
MCHC: 30.5 g/dL (ref 30.0–36.0)
MCV: 87.9 fL (ref 80.0–100.0)
MONO ABS: 0.8 10*3/uL (ref 0.1–1.0)
MONOS PCT: 8 %
NEUTROS ABS: 7.1 10*3/uL (ref 1.7–7.7)
Neutrophils Relative %: 73 %
Platelets: 481 10*3/uL — ABNORMAL HIGH (ref 150–400)
RBC: 3.88 MIL/uL (ref 3.87–5.11)
RDW: 12 % (ref 11.5–15.5)
WBC: 9.6 10*3/uL (ref 4.0–10.5)
nRBC: 0 % (ref 0.0–0.2)

## 2018-04-13 LAB — COMPREHENSIVE METABOLIC PANEL
ALT: 13 U/L (ref 0–44)
ANION GAP: 8 (ref 5–15)
AST: 14 U/L — ABNORMAL LOW (ref 15–41)
Albumin: 3.4 g/dL — ABNORMAL LOW (ref 3.5–5.0)
Alkaline Phosphatase: 97 U/L (ref 38–126)
BUN: 9 mg/dL (ref 6–20)
CHLORIDE: 110 mmol/L (ref 98–111)
CO2: 22 mmol/L (ref 22–32)
CREATININE: 0.66 mg/dL (ref 0.44–1.00)
Calcium: 9 mg/dL (ref 8.9–10.3)
GFR calc non Af Amer: >60 mL/min (ref >60)
Glucose, Bld: 100 mg/dL — ABNORMAL HIGH (ref 70–99)
POTASSIUM: 3.9 mmol/L (ref 3.5–5.1)
SODIUM: 140 mmol/L (ref 135–145)
Total Bilirubin: 0.6 mg/dL (ref 0.3–1.2)
Total Protein: 7 g/dL (ref 6.5–8.1)

## 2018-04-13 NOTE — Progress Notes (Signed)
No  PCP  Denies any cardiac history or testing. Never seen a cardiologist.

## 2018-04-16 NOTE — Anesthesia Preprocedure Evaluation (Addendum)
Anesthesia Evaluation  Patient identified by MRN, date of birth, ID band Patient awake    Reviewed: Allergy & Precautions, NPO status , Patient's Chart, lab work & pertinent test results  Airway Mallampati: I  TM Distance: >3 FB Neck ROM: Full    Dental no notable dental hx. (+) Teeth Intact, Dental Advisory Given   Pulmonary asthma ,    Pulmonary exam normal breath sounds clear to auscultation       Cardiovascular negative cardio ROS Normal cardiovascular exam Rhythm:Regular Rate:Normal     Neuro/Psych PSYCHIATRIC DISORDERS Depression negative neurological ROS     GI/Hepatic negative GI ROS, Neg liver ROS,   Endo/Other  negative endocrine ROS  Renal/GU negative Renal ROS  negative genitourinary   Musculoskeletal negative musculoskeletal ROS (+)   Abdominal   Peds  Hematology  (+) Blood dyscrasia, anemia ,   Anesthesia Other Findings H/o perforated appendix c/b abdominal abscess  Reproductive/Obstetrics                            Anesthesia Physical Anesthesia Plan  ASA: II  Anesthesia Plan: General   Post-op Pain Management:    Induction: Intravenous  PONV Risk Score and Plan: 3 and Midazolam, Dexamethasone, Ondansetron and Scopolamine patch - Pre-op  Airway Management Planned: Oral ETT  Additional Equipment:   Intra-op Plan:   Post-operative Plan: Extubation in OR  Informed Consent: I have reviewed the patients History and Physical, chart, labs and discussed the procedure including the risks, benefits and alternatives for the proposed anesthesia with the patient or authorized representative who has indicated his/her understanding and acceptance.   Dental advisory given  Plan Discussed with: CRNA  Anesthesia Plan Comments:       Anesthesia Quick Evaluation

## 2018-04-17 ENCOUNTER — Encounter (HOSPITAL_COMMUNITY): Payer: Self-pay | Admitting: *Deleted

## 2018-04-17 ENCOUNTER — Ambulatory Visit (HOSPITAL_COMMUNITY): Payer: 59 | Admitting: Certified Registered Nurse Anesthetist

## 2018-04-17 ENCOUNTER — Inpatient Hospital Stay (HOSPITAL_COMMUNITY)
Admission: RE | Admit: 2018-04-17 | Discharge: 2018-04-18 | DRG: 341 | Disposition: A | Discharge status: Home / Self Care | Payer: 59 | Source: Ambulatory Visit | Attending: Surgery | Admitting: Surgery

## 2018-04-17 ENCOUNTER — Encounter (HOSPITAL_COMMUNITY): Admission: RE | Disposition: A | Discharge status: Home / Self Care | Payer: Self-pay | Source: Ambulatory Visit

## 2018-04-17 ENCOUNTER — Other Ambulatory Visit: Payer: Self-pay

## 2018-04-17 DIAGNOSIS — K381 Appendicular concretions: Principal | ICD-10-CM | POA: Diagnosis present

## 2018-04-17 DIAGNOSIS — K651 Peritoneal abscess: Secondary | ICD-10-CM | POA: Diagnosis present

## 2018-04-17 DIAGNOSIS — T8143XA Infection following a procedure, organ and space surgical site, initial encounter: Secondary | ICD-10-CM | POA: Diagnosis present

## 2018-04-17 DIAGNOSIS — T8149XA Infection following a procedure, other surgical site, initial encounter: Secondary | ICD-10-CM

## 2018-04-17 HISTORY — PX: LAPAROSCOPY: SHX197

## 2018-04-17 LAB — POCT PREGNANCY, URINE: Preg Test, Ur: NEGATIVE

## 2018-04-17 SURGERY — LAPAROSCOPY, DIAGNOSTIC
Anesthesia: General | Site: Abdomen

## 2018-04-17 MED ORDER — CEFOTETAN DISODIUM-DEXTROSE 2-2.08 GM-%(50ML) IV SOLR
INTRAVENOUS | Status: AC
  Filled 2018-04-17: qty 50

## 2018-04-17 MED ORDER — ENOXAPARIN SODIUM 40 MG/0.4ML ~~LOC~~ SOLN
40.0000 mg | SUBCUTANEOUS | Status: DC
  Filled 2018-04-17: qty 0.4

## 2018-04-17 MED ORDER — MIDAZOLAM HCL 2 MG/2ML IJ SOLN
INTRAMUSCULAR | Status: AC
  Filled 2018-04-17: qty 2

## 2018-04-17 MED ORDER — OXYCODONE HCL 5 MG PO TABS: 5.0000 mg | ORAL_TABLET | ORAL | Status: DC | PRN

## 2018-04-17 MED ORDER — ACETAMINOPHEN 500 MG PO TABS
ORAL_TABLET | ORAL | Status: AC
  Filled 2018-04-17: qty 2

## 2018-04-17 MED ORDER — BUPIVACAINE-EPINEPHRINE (PF) 0.25% -1:200000 IJ SOLN
INTRAMUSCULAR | Status: AC
  Filled 2018-04-17: qty 30

## 2018-04-17 MED ORDER — GABAPENTIN 300 MG PO CAPS
300.0000 mg | ORAL_CAPSULE | Freq: Two times a day (BID) | ORAL | Status: DC
  Administered 2018-04-17 (×2): 300 mg via ORAL
  Filled 2018-04-17 (×2): qty 1

## 2018-04-17 MED ORDER — TRAMADOL HCL 50 MG PO TABS
50.0000 mg | ORAL_TABLET | Freq: Four times a day (QID) | ORAL | Status: DC | PRN
  Administered 2018-04-17 – 2018-04-18 (×2): 50 mg via ORAL
  Filled 2018-04-17 (×2): qty 1

## 2018-04-17 MED ORDER — SODIUM CHLORIDE 0.9 % IV SOLN: 250.0000 mL | INTRAVENOUS | Status: DC | PRN

## 2018-04-17 MED ORDER — CELECOXIB 200 MG PO CAPS
ORAL_CAPSULE | ORAL | Status: AC
  Filled 2018-04-17: qty 1

## 2018-04-17 MED ORDER — SODIUM CHLORIDE 0.9% FLUSH: 3.0000 mL | Freq: Two times a day (BID) | INTRAVENOUS | Status: DC

## 2018-04-17 MED ORDER — PROPOFOL 10 MG/ML IV BOLUS
INTRAVENOUS | Status: AC
  Filled 2018-04-17: qty 20

## 2018-04-17 MED ORDER — METHOCARBAMOL 500 MG PO TABS
500.0000 mg | ORAL_TABLET | Freq: Four times a day (QID) | ORAL | Status: DC | PRN
  Administered 2018-04-17: 500 mg via ORAL
  Filled 2018-04-17: qty 1

## 2018-04-17 MED ORDER — FENTANYL CITRATE (PF) 100 MCG/2ML IJ SOLN
25.0000 ug | INTRAMUSCULAR | Status: DC | PRN
  Administered 2018-04-17 (×3): 25 ug via INTRAVENOUS

## 2018-04-17 MED ORDER — ACETAMINOPHEN 650 MG RE SUPP: 650.0000 mg | Freq: Four times a day (QID) | RECTAL | Status: DC | PRN

## 2018-04-17 MED ORDER — ROCURONIUM BROMIDE 10 MG/ML (PF) SYRINGE
PREFILLED_SYRINGE | INTRAVENOUS | Status: DC | PRN
  Administered 2018-04-17: 40 mg via INTRAVENOUS
  Administered 2018-04-17: 10 mg via INTRAVENOUS

## 2018-04-17 MED ORDER — SODIUM CHLORIDE 0.9 % IV SOLN: INTRAVENOUS | Status: DC | PRN

## 2018-04-17 MED ORDER — EPHEDRINE 5 MG/ML INJ
INTRAVENOUS | Status: AC
  Filled 2018-04-17: qty 10

## 2018-04-17 MED ORDER — METRONIDAZOLE IN NACL 5-0.79 MG/ML-% IV SOLN
500.0000 mg | Freq: Three times a day (TID) | INTRAVENOUS | Status: DC
  Administered 2018-04-17 – 2018-04-18 (×2): 500 mg via INTRAVENOUS
  Filled 2018-04-17 (×3): qty 100

## 2018-04-17 MED ORDER — ACETAMINOPHEN 325 MG PO TABS: 650.0000 mg | ORAL_TABLET | Freq: Four times a day (QID) | ORAL | Status: DC | PRN

## 2018-04-17 MED ORDER — GABAPENTIN 300 MG PO CAPS
ORAL_CAPSULE | ORAL | Status: AC
  Filled 2018-04-17: qty 1

## 2018-04-17 MED ORDER — SODIUM CHLORIDE 0.9 % IV SOLN
2.0000 g | INTRAVENOUS | Status: DC
  Administered 2018-04-17: 2 g via INTRAVENOUS
  Filled 2018-04-17: qty 20

## 2018-04-17 MED ORDER — LIDOCAINE 2% (20 MG/ML) 5 ML SYRINGE
INTRAMUSCULAR | Status: AC
  Filled 2018-04-17: qty 5

## 2018-04-17 MED ORDER — BUPIVACAINE-EPINEPHRINE 0.25% -1:200000 IJ SOLN
INTRAMUSCULAR | Status: DC | PRN
  Administered 2018-04-17: 3 mL

## 2018-04-17 MED ORDER — PROMETHAZINE HCL 25 MG/ML IJ SOLN
INTRAMUSCULAR | Status: AC
  Administered 2018-04-17: 6.25 mg via INTRAVENOUS
  Filled 2018-04-17: qty 1

## 2018-04-17 MED ORDER — MIDAZOLAM HCL 2 MG/2ML IJ SOLN
INTRAMUSCULAR | Status: DC | PRN
  Administered 2018-04-17 (×2): 1 mg via INTRAVENOUS

## 2018-04-17 MED ORDER — ONDANSETRON HCL 4 MG/2ML IJ SOLN
INTRAMUSCULAR | Status: DC | PRN
  Administered 2018-04-17: 4 mg via INTRAVENOUS

## 2018-04-17 MED ORDER — KCL IN DEXTROSE-NACL 20-5-0.9 MEQ/L-%-% IV SOLN
INTRAVENOUS | Status: DC
  Administered 2018-04-17: 12:00:00 via INTRAVENOUS
  Filled 2018-04-17 (×2): qty 1000

## 2018-04-17 MED ORDER — LIDOCAINE 2% (20 MG/ML) 5 ML SYRINGE
INTRAMUSCULAR | Status: DC | PRN
  Administered 2018-04-17: 40 mg via INTRAVENOUS

## 2018-04-17 MED ORDER — CHLORHEXIDINE GLUCONATE CLOTH 2 % EX PADS: 6.0000 | MEDICATED_PAD | Freq: Once | CUTANEOUS | Status: DC

## 2018-04-17 MED ORDER — ONDANSETRON HCL 4 MG/2ML IJ SOLN
INTRAMUSCULAR | Status: AC
  Filled 2018-04-17: qty 2

## 2018-04-17 MED ORDER — FENTANYL CITRATE (PF) 100 MCG/2ML IJ SOLN
INTRAMUSCULAR | Status: AC
  Filled 2018-04-17: qty 2

## 2018-04-17 MED ORDER — DEXAMETHASONE SODIUM PHOSPHATE 10 MG/ML IJ SOLN
INTRAMUSCULAR | Status: DC | PRN
  Administered 2018-04-17: 10 mg via INTRAVENOUS

## 2018-04-17 MED ORDER — SODIUM CHLORIDE 0.9% FLUSH: 3.0000 mL | INTRAVENOUS | Status: DC | PRN

## 2018-04-17 MED ORDER — FENTANYL CITRATE (PF) 250 MCG/5ML IJ SOLN
INTRAMUSCULAR | Status: DC | PRN
  Administered 2018-04-17: 25 ug via INTRAVENOUS
  Administered 2018-04-17: 100 ug via INTRAVENOUS
  Administered 2018-04-17: 25 ug via INTRAVENOUS

## 2018-04-17 MED ORDER — FENTANYL CITRATE (PF) 250 MCG/5ML IJ SOLN
INTRAMUSCULAR | Status: AC
  Filled 2018-04-17: qty 5

## 2018-04-17 MED ORDER — SCOPOLAMINE 1 MG/3DAYS TD PT72
1.0000 | MEDICATED_PATCH | TRANSDERMAL | Status: DC
  Administered 2018-04-17: 1 via TRANSDERMAL
  Filled 2018-04-17: qty 1

## 2018-04-17 MED ORDER — CEFOTETAN DISODIUM-DEXTROSE 2-2.08 GM-%(50ML) IV SOLR
2.0000 g | INTRAVENOUS | Status: AC
  Administered 2018-04-17: 2 g via INTRAVENOUS

## 2018-04-17 MED ORDER — CELECOXIB 200 MG PO CAPS
200.0000 mg | ORAL_CAPSULE | ORAL | Status: AC
  Administered 2018-04-17: 200 mg via ORAL

## 2018-04-17 MED ORDER — KETOROLAC TROMETHAMINE 30 MG/ML IJ SOLN
30.0000 mg | Freq: Four times a day (QID) | INTRAMUSCULAR | Status: DC | PRN
  Administered 2018-04-17: 30 mg via INTRAVENOUS
  Filled 2018-04-17: qty 1

## 2018-04-17 MED ORDER — HEMOSTATIC AGENTS (NO CHARGE) OPTIME
TOPICAL | Status: DC | PRN
  Administered 2018-04-17 (×2): 1 via TOPICAL

## 2018-04-17 MED ORDER — ACETAMINOPHEN 500 MG PO TABS: 1000.0000 mg | ORAL_TABLET | ORAL | Status: DC

## 2018-04-17 MED ORDER — STERILE WATER FOR IRRIGATION IR SOLN
Status: DC | PRN
  Administered 2018-04-17: 1000 mL

## 2018-04-17 MED ORDER — SUCCINYLCHOLINE CHLORIDE 200 MG/10ML IV SOSY
PREFILLED_SYRINGE | INTRAVENOUS | Status: AC
  Filled 2018-04-17: qty 10

## 2018-04-17 MED ORDER — FENTANYL CITRATE (PF) 100 MCG/2ML IJ SOLN: 50.0000 ug | INTRAMUSCULAR | Status: DC | PRN

## 2018-04-17 MED ORDER — PHENYLEPHRINE 40 MCG/ML (10ML) SYRINGE FOR IV PUSH (FOR BLOOD PRESSURE SUPPORT)
PREFILLED_SYRINGE | INTRAVENOUS | Status: AC
  Filled 2018-04-17: qty 10

## 2018-04-17 MED ORDER — ROCURONIUM BROMIDE 50 MG/5ML IV SOSY
PREFILLED_SYRINGE | INTRAVENOUS | Status: AC
  Filled 2018-04-17: qty 5

## 2018-04-17 MED ORDER — 0.9 % SODIUM CHLORIDE (POUR BTL) OPTIME
TOPICAL | Status: DC | PRN
  Administered 2018-04-17: 1000 mL

## 2018-04-17 MED ORDER — PROPOFOL 10 MG/ML IV BOLUS
INTRAVENOUS | Status: DC | PRN
  Administered 2018-04-17: 100 mg via INTRAVENOUS

## 2018-04-17 MED ORDER — LACTATED RINGERS IV SOLN
INTRAVENOUS | Status: DC | PRN
  Administered 2018-04-17 (×2): via INTRAVENOUS

## 2018-04-17 MED ORDER — CELECOXIB 200 MG PO CAPS
200.0000 mg | ORAL_CAPSULE | Freq: Two times a day (BID) | ORAL | Status: DC
  Administered 2018-04-17 (×2): 200 mg via ORAL
  Filled 2018-04-17 (×2): qty 1

## 2018-04-17 MED ORDER — SUGAMMADEX SODIUM 200 MG/2ML IV SOLN
INTRAVENOUS | Status: DC | PRN
  Administered 2018-04-17: 80 mg via INTRAVENOUS

## 2018-04-17 MED ORDER — PROMETHAZINE HCL 25 MG/ML IJ SOLN
6.2500 mg | INTRAMUSCULAR | Status: DC | PRN
  Administered 2018-04-17: 6.25 mg via INTRAVENOUS

## 2018-04-17 MED ORDER — GABAPENTIN 300 MG PO CAPS
300.0000 mg | ORAL_CAPSULE | ORAL | Status: AC
  Administered 2018-04-17: 300 mg via ORAL

## 2018-04-17 MED ORDER — DEXAMETHASONE SODIUM PHOSPHATE 10 MG/ML IJ SOLN
INTRAMUSCULAR | Status: AC
  Filled 2018-04-17: qty 1

## 2018-04-17 MED ORDER — BUPIVACAINE LIPOSOME 1.3 % IJ SUSP
20.0000 mL | INTRAMUSCULAR | Status: AC
  Administered 2018-04-17: 20 mL
  Filled 2018-04-17: qty 20

## 2018-04-17 MED ORDER — SODIUM CHLORIDE 0.9 % IR SOLN
Status: DC | PRN
  Administered 2018-04-17: 1000 mL

## 2018-04-17 SURGICAL SUPPLY — 45 items
CANISTER SUCT 3000ML PPV (MISCELLANEOUS) ×3 IMPLANT
CHLORAPREP W/TINT 26ML (MISCELLANEOUS) ×3 IMPLANT
COVER SURGICAL LIGHT HANDLE (MISCELLANEOUS) ×3 IMPLANT
COVER WAND RF STERILE (DRAPES) ×3 IMPLANT
DECANTER SPIKE VIAL GLASS SM (MISCELLANEOUS) ×3 IMPLANT
DERMABOND ADVANCED (GAUZE/BANDAGES/DRESSINGS) ×2
DERMABOND ADVANCED .7 DNX12 (GAUZE/BANDAGES/DRESSINGS) ×1 IMPLANT
DISSECTOR BLUNT TIP ENDO 5MM (MISCELLANEOUS) ×3 IMPLANT
ELECT CAUTERY BLADE 6.4 (BLADE) ×3 IMPLANT
ELECT REM PT RETURN 9FT ADLT (ELECTROSURGICAL) ×3
ELECTRODE REM PT RTRN 9FT ADLT (ELECTROSURGICAL) ×1 IMPLANT
GLOVE BIO SURGEON STRL SZ8 (GLOVE) ×3 IMPLANT
GLOVE BIOGEL PI IND STRL 7.0 (GLOVE) ×3 IMPLANT
GLOVE BIOGEL PI IND STRL 8 (GLOVE) ×1 IMPLANT
GLOVE BIOGEL PI INDICATOR 7.0 (GLOVE) ×6
GLOVE BIOGEL PI INDICATOR 8 (GLOVE) ×2
GLOVE SS BIOGEL STRL SZ 7 (GLOVE) ×1 IMPLANT
GLOVE SUPERSENSE BIOGEL SZ 7 (GLOVE) ×2
GOWN STRL REUS W/ TWL LRG LVL3 (GOWN DISPOSABLE) ×3 IMPLANT
GOWN STRL REUS W/ TWL XL LVL3 (GOWN DISPOSABLE) ×1 IMPLANT
GOWN STRL REUS W/TWL LRG LVL3 (GOWN DISPOSABLE) ×6
GOWN STRL REUS W/TWL XL LVL3 (GOWN DISPOSABLE) ×2
HEMOSTAT SNOW SURGICEL 2X4 (HEMOSTASIS) ×6 IMPLANT
KIT BASIN OR (CUSTOM PROCEDURE TRAY) ×3 IMPLANT
KIT TURNOVER KIT B (KITS) ×3 IMPLANT
NS IRRIG 1000ML POUR BTL (IV SOLUTION) ×3 IMPLANT
PAD ARMBOARD 7.5X6 YLW CONV (MISCELLANEOUS) IMPLANT
PENCIL BUTTON HOLSTER BLD 10FT (ELECTRODE) ×3 IMPLANT
SCISSORS LAP 5X35 DISP (ENDOMECHANICALS) IMPLANT
SET IRRIG TUBING LAPAROSCOPIC (IRRIGATION / IRRIGATOR) ×3 IMPLANT
SHEARS HARMONIC ACE PLUS 36CM (ENDOMECHANICALS) IMPLANT
SLEEVE ENDOPATH XCEL 5M (ENDOMECHANICALS) ×9 IMPLANT
SUT MNCRL AB 3-0 PS2 18 (SUTURE) ×6 IMPLANT
SUT MNCRL AB 4-0 PS2 18 (SUTURE) ×3 IMPLANT
SUT PDS AB 1 TP1 54 (SUTURE) ×6 IMPLANT
SUT PDS AB 1 TP1 96 (SUTURE) ×6 IMPLANT
SYS LAPSCP GELPORT 120MM (MISCELLANEOUS) ×3
SYSTEM LAPSCP GELPORT 120MM (MISCELLANEOUS) ×1 IMPLANT
TOWEL GREEN STERILE FF (TOWEL DISPOSABLE) ×3 IMPLANT
TRAY FOLEY MTR SLVR 14FR STAT (SET/KITS/TRAYS/PACK) ×3 IMPLANT
TRAY LAPAROSCOPIC MC (CUSTOM PROCEDURE TRAY) ×3 IMPLANT
TROCAR XCEL BLUNT TIP 100MML (ENDOMECHANICALS) IMPLANT
TROCAR XCEL NON-BLD 11X100MML (ENDOMECHANICALS) IMPLANT
TROCAR XCEL NON-BLD 5MMX100MML (ENDOMECHANICALS) ×3 IMPLANT
TUBING INSUFFLATION (TUBING) ×3 IMPLANT

## 2018-04-17 NOTE — Op Note (Signed)
Preoperative diagnosis: Intra-abdominal abscess with appendicolith  Postoperative diagnosis: Same  Procedure: Hand-assisted laparoscopic drainage of subhepatic intra-abdominal abscess with removal of appendicolith  Surgeon: Erroll Luna, MD  Anesthesia: General with Exparel local  Drains: None  EBL: 100 cc  Specimen: Appendicolith to pathology  IV fluids: Per anesthesia record  Indications for procedure: The patient presents status post percutaneous drainage x2 after perforated appendicitis 6 weeks ago.  She had a transgluteal drain placed by IR.  She did not improve and had to have a laparoscopic drain placed 6 weeks ago.  She has slowly recovered and both drains been removed but she has had low-grade fever and CT scan shows an appendicolith located in the right upper quadrant below the right lateral lobe of the liver superior to the kidney.  She had a 3 cm fluid collection there on CT scan.  Given the fact that she had an appendicolith and this could not be treated percutaneously I recommended laparoscopy to remove the appendicolith and drain the subhepatic fluid collection.  I discussed potential risk of surgery.  I discussed laparoscopic, hand-assisted techniques and open techniques since this is a difficult place to access.  She was aware that any of these techniques could be required.  We discussed postoperative recovery and long-term expectation and the rationale for doing this.  Concern was for recurrent infection she did have a low-grade white count low-grade fever postoperatively and chronic fatigue which I felt was secondary to this.  I discussed possible appendectomy with told her that this may not be possible at this point time and drainage of the abscess was more my primary concern.The procedure has been discussed with the patient.  Alternative therapies have been discussed with the patient.  Operative risks include bleeding,  Infection,  Organ injury,  Nerve injury,  Blood vessel  injury,  DVT,  Pulmonary embolism,  Death,  And possible reoperation.  Medical management risks include worsening of present situation.  The success of the procedure is 50 -90 % at treating patients symptoms.  The patient understands and agrees to proceed.    Description of procedure: The patient was met in the holding area and questions were answered.  She was taken back to the operative room placed supine upon the operating table.  After induction of general anesthesia both arms were tucked and the abdomen was prepped and draped in sterile fashion after insertion of Foley catheter under sterile conditions.  Timeout was done.  Left lower quadrant 5 mm Optiview port was placed.  This was done under direct vision going through all layers of the abdominal cavity and pneumoperitoneum was established without traumatic injury.  I placed an additional 5 mm port in left upper quadrant.  I then placed 2 additional 5 mm ports in the right lower and right upper quadrant.  Laparoscope was placed.  She had adhesions to her left lower quadrant from previous percutaneous drainage and pelvic abscesses but I could not easily see the appendix.  I then retracted the right lobe liver with by using the gallbladder as a handle.  This was grasped and pulled toward the patient's left shoulder.  This open the right subhepatic space up.  I identified the abscess and this was densely adherent to the undersurface of the liver.  Using laparoscopic technique some try to bluntly dissect the abscess cavity off the right lateral lobe of the liver.  Unfortunately this was not possible.  I opted to place a hand port in the upper midline.  A 7 cm meter incision was made.  Dissection was carried down to her midline fascia and this was opened and the hand port was inserted without difficulty.  I then reestablished pneumoperitoneum.  With my hand I could feel the abscess cavity and the appendicolith was able to dissect this off the right lobe of the  liver superior to the right kidney.  I then retrieved the appendicolith and removed.  This was then sent passed off the field.  There is a 3 cm area of inflammation and a small cavity that did not have any pus that I could see.  This was irrigated though copiously.  Hemostasis was achieved with commissure cautery direct pressure and Surgicel snow.  Hemostasis was excellent.  The gallbladder was examined carefully and there is no evidence of injury to the gallbladder.  In the right lower quadrant there were dense adhesions but no appendix could be easily visualized.  The remainder of her four-quadrant laparoscopy was normal.  At this portion remove the HandPort in the 5 mm ports.  The CO2 escaped.  I closed the fascia with single stranded 1 PDS.  I infiltrated the rectus muscles with Exparel.  All port sites were infiltrated with Exparel as well.  Skin incisions were closed with 3-0 Monocryl.  Dermabond applied.  All final counts were found to be correct.  The Foley catheter was removed.  Patient was awoke extubated taken to recovery in satisfactory condition.

## 2018-04-17 NOTE — Transfer of Care (Signed)
Immediate Anesthesia Transfer of Care Note  Patient: Lindsey Tucker  Procedure(s) Performed: LAPAROSCOPIC HAND ASSISTED  DRAINAGE OF ABDOMINAL ABSCESS WITH REMOVAL OF APPENDICOLITH (N/A Abdomen)  Patient Location: PACU  Anesthesia Type:General  Level of Consciousness: drowsy and patient cooperative  Airway & Oxygen Therapy: Patient Spontanous Breathing  Post-op Assessment: Report given to RN, Post -op Vital signs reviewed and stable and Patient moving all extremities X 4  Post vital signs: Reviewed and stable  Last Vitals:  Vitals Value Taken Time  BP 111/76 04/17/2018  9:35 AM  Temp 36.3 C 04/17/2018  9:36 AM  Pulse 100 04/17/2018  9:37 AM  Resp 17 04/17/2018  9:37 AM  SpO2 100 % 04/17/2018  9:37 AM  Vitals shown include unvalidated device data.  Last Pain:  Vitals:   04/17/18 0618  TempSrc: Oral         Complications: No apparent anesthesia complications

## 2018-04-17 NOTE — Interval H&P Note (Signed)
History and Physical Interval Note:  04/17/2018 7:20 AM  Lindsey Tucker  has presented today for surgery, with the diagnosis of abscess  The various methods of treatment have been discussed with the patient and family. After consideration of risks, benefits and other options for treatment, the patient has consented to  Procedure(s): LAPAROSCOPIC ABDOMINAL ABSCESS DRAINAGE ERAS PATHWAY (N/A) as a surgical intervention .  The patient's history has been reviewed, patient examined, no change in status, stable for surgery.  I have reviewed the patient's chart and labs.  Questions were answered to the patient's satisfaction.     Leston Schueller A Tomika Eckles

## 2018-04-17 NOTE — Anesthesia Procedure Notes (Signed)
Procedure Name: Intubation Date/Time: 04/17/2018 7:42 AM Performed by: Julieta Bellini, CRNA Pre-anesthesia Checklist: Patient identified, Emergency Drugs available, Suction available and Patient being monitored Patient Re-evaluated:Patient Re-evaluated prior to induction Oxygen Delivery Method: Circle system utilized Preoxygenation: Pre-oxygenation with 100% oxygen Induction Type: IV induction Ventilation: Mask ventilation without difficulty Laryngoscope Size: Mac and 3 Grade View: Grade I Tube type: Oral Tube size: 7.0 mm Number of attempts: 1 Airway Equipment and Method: Stylet Placement Confirmation: ETT inserted through vocal cords under direct vision,  positive ETCO2 and breath sounds checked- equal and bilateral Secured at: 21 cm Tube secured with: Tape Dental Injury: Teeth and Oropharynx as per pre-operative assessment

## 2018-04-17 NOTE — Anesthesia Postprocedure Evaluation (Signed)
Anesthesia Post Note  Patient: Lindsey Tucker  Procedure(s) Performed: LAPAROSCOPIC HAND ASSISTED  DRAINAGE OF ABDOMINAL ABSCESS WITH REMOVAL OF APPENDICOLITH (N/A Abdomen)     Patient location during evaluation: PACU Anesthesia Type: General Level of consciousness: awake and alert Pain management: pain level controlled Vital Signs Assessment: post-procedure vital signs reviewed and stable Respiratory status: spontaneous breathing, nonlabored ventilation, respiratory function stable and patient connected to nasal cannula oxygen Cardiovascular status: blood pressure returned to baseline and stable Postop Assessment: no apparent nausea or vomiting Anesthetic complications: no    Last Vitals:  Vitals:   04/17/18 1533 04/17/18 1902  BP: 100/71 104/67  Pulse: 84 (!) 58  Resp: 16 16  Temp: 36.9 C 37.4 C  SpO2: 100% 98%    Last Pain:  Vitals:   04/17/18 1902  TempSrc: Oral  PainSc:                  [name] L Cem Kosman

## 2018-04-18 ENCOUNTER — Encounter (HOSPITAL_COMMUNITY): Payer: Self-pay | Admitting: Surgery

## 2018-04-18 LAB — BASIC METABOLIC PANEL
Anion gap: 7 (ref 5–15)
BUN: 11 mg/dL (ref 6–20)
CHLORIDE: 107 mmol/L (ref 98–111)
CO2: 26 mmol/L (ref 22–32)
CREATININE: 0.67 mg/dL (ref 0.44–1.00)
Calcium: 8.7 mg/dL — ABNORMAL LOW (ref 8.9–10.3)
GFR calc Af Amer: >60 mL/min (ref >60)
Glucose, Bld: 122 mg/dL — ABNORMAL HIGH (ref 70–99)
POTASSIUM: 3.9 mmol/L (ref 3.5–5.1)
SODIUM: 140 mmol/L (ref 135–145)

## 2018-04-18 LAB — CBC
HCT: 25.9 % — ABNORMAL LOW (ref 36.0–46.0)
HEMOGLOBIN: 8.3 g/dL — AB (ref 12.0–15.0)
MCH: 27.4 pg (ref 26.0–34.0)
MCHC: 32 g/dL (ref 30.0–36.0)
MCV: 85.5 fL (ref 80.0–100.0)
PLATELETS: 397 10*3/uL (ref 150–400)
RBC: 3.03 MIL/uL — AB (ref 3.87–5.11)
RDW: 12.1 % (ref 11.5–15.5)
WBC: 11.4 10*3/uL — ABNORMAL HIGH (ref 4.0–10.5)
nRBC: 0 % (ref 0.0–0.2)

## 2018-04-18 MED ORDER — AMOXICILLIN-POT CLAVULANATE 875-125 MG PO TABS: 1.0000 | ORAL_TABLET | Freq: Two times a day (BID) | ORAL | 0 refills | Status: AC

## 2018-04-18 MED ORDER — OXYCODONE HCL 5 MG PO TABS: 5.0000 mg | ORAL_TABLET | ORAL | 0 refills | Status: DC | PRN

## 2018-04-18 NOTE — Progress Notes (Signed)
Pt discharged home with Husband., Pt left via w/c to front of building with staff

## 2018-04-18 NOTE — Progress Notes (Signed)
Paged Dr Magnus Ivan re: BP 85/62. Patient was sleeping, stated can be normal to run low. Explained patient ambulates to bathroom well tolerated, no complaints. No new orders given.

## 2018-04-18 NOTE — Discharge Summary (Signed)
     Patient ID: Lindsey Tucker [id number] 1985/09/29 32 y.o.  Admit date: 04/17/2018 Discharge date: 04/18/2018  Admitting Diagnosis: Intra-abdominal abscess H/O perforated appendicitis with abscess  Discharge Diagnosis Patient Active Problem List   Diagnosis Date Noted  . Postprocedural intraabdominal abscess 04/17/2018  . Appendicitis with abscess 03/01/2018  . Postpartum care following cesarean delivery (8/14) 02/02/2016  . Breech presentation delivered 02/01/2016  . Asthma 02/24/2011    Consultants none  Reason for Admission: Patient returns for follow-up of her intra-abdominal abscess. Both for percutaneous drainage removed. On her most recent CT there is a fluid collection along the inferior upper border of the right lobe of the liver consistent with abscess and probable migrated appendicolith. White count 11,000. She has had low-grade fevers but is able to function taking care of her children. She denies any severe abdominal pain no. Specifically, she denies any right upper quadrant pain or back pain. She does have some left-sided discomfort she states. No dysuria or polyuria.  Procedures Hand-assisted laparoscopic drainage of subhepatic intra-abdominal abscess with removal of appendicolith  Hospital Course:  The patient was admitted following the above procedure.  She was doing well on POD 1 with her a regular diet, passing flatus, pain well controlled, voiding, and mobilizing well.  She was stable at this time for DC home.  Physical Exam: Abd: soft, appropriately tender, +BS, ND, incisions c/d/i with dermabond  Allergies as of 04/18/2018      Reactions   Morphine And Related Itching, Swelling   SWELLING REACTION UNSPECIFIED       Medication List    STOP taking these medications   oxyCODONE-acetaminophen 5-325 MG tablet Commonly known as:  PERCOCET/ROXICET     TAKE these medications   acetaminophen 500 MG tablet Commonly known as:   TYLENOL Take 1,000 mg by mouth every 6 (six) hours as needed for moderate pain or fever.   amoxicillin-clavulanate 875-125 MG tablet Commonly known as:  AUGMENTIN Take 1 tablet by mouth every 12 (twelve) hours for 5 days.   fluconazole 200 MG tablet Commonly known as:  DIFLUCAN Take 1 tablet (200 mg total) by mouth daily.   ibuprofen 200 MG tablet Commonly known as:  ADVIL,MOTRIN Take 800 mg by mouth every 8 (eight) hours as needed (pain).   loratadine 10 MG tablet Commonly known as:  CLARITIN Take 10 mg by mouth daily as needed for allergies.   oxyCODONE 5 MG immediate release tablet Commonly known as:  Oxy IR/ROXICODONE Take 1 tablet (5 mg total) by mouth every 4 (four) hours as needed for moderate pain.        Follow-up Information    Cornett, Maisie Fus, MD Follow up in 2 week(s).   Specialty:  General Surgery Why:  Go to already scheduled follow up appointment Contact information: [address] [place] [address] [phone]           Signed: Barnetta Chapel, [facility] 04/18/2018, 8:11 AM Pager: [phone]

## 2018-04-18 NOTE — Discharge Instructions (Signed)
[facility]      [facility] [phone]  OPEN ABDOMINAL SURGERY: POST OP INSTRUCTIONS  Always review your discharge instruction sheet given to you by the facility where your surgery was performed.  IF YOU HAVE DISABILITY OR FAMILY LEAVE FORMS, YOU MUST BRING THEM TO THE OFFICE FOR PROCESSING.  PLEASE DO NOT GIVE THEM TO YOUR DOCTOR.  1. A prescription for pain medication may be given to you upon discharge.  Take your pain medication as prescribed, if needed.  If narcotic pain medicine is not needed, then you may take acetaminophen (Tylenol) or ibuprofen (Advil) as needed. 2. Take your usually prescribed medications unless otherwise directed. 3. If you need a refill on your pain medication, please contact your pharmacy. They will contact our office to request authorization.  Prescriptions will not be filled after 5pm or on week-ends. 4. You should follow a light diet the first few days after arrival home, such as soup and crackers, pudding, etc.unless your doctor has advised otherwise. A high-fiber, low fat diet can be resumed as tolerated.   Be sure to include lots of fluids daily. Most patients will experience some swelling and bruising on the chest and neck area.  Ice packs will help.  Swelling and bruising can take several days to resolve 5. Most patients will experience some swelling and bruising in the area of the incision. Ice pack will help. Swelling and bruising can take several days to resolve..  6. It is common to experience some constipation if taking pain medication after surgery.  Increasing fluid intake and taking a stool softener will usually help or prevent this problem from occurring.  A mild laxative (Milk of Magnesia or Miralax) should be taken according to package directions if there are no bowel movements after 48 hours. 7.  You may have steri-strips (small skin tapes) in place directly over the incision.  These strips should be left on the skin for 7-10 days.  If your  surgeon used skin glue on the incision, you may shower in 24 hours.  The glue will flake off over the next 2-3 weeks.  Any sutures or staples will be removed at the office during your follow-up visit. You may find that a light gauze bandage over your incision may keep your staples from being rubbed or pulled. You may shower and replace the bandage daily. 8. ACTIVITIES:  You may resume regular (light) daily activities beginning the next day--such as daily self-care, walking, climbing stairs--gradually increasing activities as tolerated.  You may have sexual intercourse when it is comfortable.  Refrain from any heavy lifting or straining until approved by your doctor. a. You may drive when you no longer are taking prescription pain medication, you can comfortably wear a seatbelt, and you can safely maneuver your car and apply brakes b. Return to Work: ___________________________________ 9. You should see your doctor in the office for a follow-up appointment approximately two weeks after your surgery.  Make sure that you call for this appointment within a day or two after you arrive home to insure a convenient appointment time. OTHER INSTRUCTIONS:  _____________________________________________________________ _____________________________________________________________  WHEN TO CALL YOUR DOCTOR: 1. Fever over 101.0 2. Inability to urinate 3. Nausea and/or vomiting 4. Extreme swelling or bruising 5. Continued bleeding from incision. 6. Increased pain, redness, or drainage from the incision. 7. Difficulty swallowing or breathing 8. Muscle cramping or spasms. 9. Numbness or tingling in hands or feet or around lips.  The clinic staff is available to   answer your questions during regular business hours.  Please don't hesitate to call and ask to speak to one of the nurses if you have concerns.  For further questions, please visit [email or web address]   

## 2018-04-18 NOTE — Plan of Care (Signed)

## 2018-11-08 ENCOUNTER — Other Ambulatory Visit: Payer: Self-pay | Admitting: Surgery

## 2018-11-08 DIAGNOSIS — R1900 Intra-abdominal and pelvic swelling, mass and lump, unspecified site: Secondary | ICD-10-CM

## 2018-11-15 ENCOUNTER — Ambulatory Visit
Admission: RE | Admit: 2018-11-15 | Discharge: 2018-11-15 | Disposition: A | Discharge status: Home / Self Care | Payer: 59 | Source: Ambulatory Visit | Attending: Surgery | Admitting: Surgery

## 2018-11-15 DIAGNOSIS — R1900 Intra-abdominal and pelvic swelling, mass and lump, unspecified site: Secondary | ICD-10-CM

## 2018-11-15 MED ORDER — IOPAMIDOL (ISOVUE-300) INJECTION 61%
100.0000 mL | Freq: Once | INTRAVENOUS | Status: AC | PRN
  Administered 2018-11-15: 16:00:00 100 mL via INTRAVENOUS

## 2018-11-20 ENCOUNTER — Encounter: Payer: Self-pay | Admitting: General Surgery

## 2018-11-20 NOTE — Progress Notes (Signed)
TELEHEALTH VISIT  Referring Provider: Erroll Luna, MD Primary Care Physician:  Aloha Gell, MD   Tele-visit due to COVID-19 pandemic Patient requested visit virtually, consented to the virtual encounter via video enabled telemedicine application (Zoom) Contact made at: 09:56 11/21/18 Patient verified by name and date of birth Location of patient: Home Location provider: [facility] Names of persons participating: Me, patient, husband Lindsey Tucker), Clever Time spent on telehealth visit: 42 minutes I discussed the limitations of evaluation and management by telemedicine. The patient expressed understanding and agreed to proceed.  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 No alarm features. Will also consider IBS masqueraders including celiac disease, lactose/fructose/gluten intolerance, SIBO, thyroid disorder.    PLAN: Trial of IBGuard for at least 2 weeks and/or probiotics (Align) for at least 2 weeks ESR, CRP, and fecal calprotectin, CMP, CBC, TSH, [place] FODMAP diet in the future if not responding to conservative treatment Ask Dr. Valentino Saxon about clinical significance of pelvic varices (referral) Follow-up in 4-8 weeks   HPI: Lindsey Tucker is a 33 y.o. female referred by Dr. Brantley Stage for further evaluation of abdominal pain.  The history is obtained to the patient, review of her electronic health record, and referral records provided by Dr. Brantley Stage.  She has a history of appendiceal perforation  and appendicolith that migrated under the right lobe of her liver with abscess formation requiring laparoscopy and laparotomy for removal in October 2019.  Her postoperative course was complicated by C. difficile.  She did well for 2 to 3 months but then developed crampy abdominal pain and a change in her bowel habits.  The patient was seen by Dr. Brantley Stage 11/05/2018 for the abdominal pain.  She reported a bulge at at the incision site, bowel irregularity, and lower abdominal pain especially in the left lower quadrant.  Pain is exacerbated by yoga.  He noted the upper incision healed with mild bulging with core engagement and mild left lower quadrant pain with palpation.  He recommended a CT scan to evaluate the abdominal wall bulge.  He recommended referral to gastroenterology to consider postinfectious IBS.  GERD as a child. Severe reflux with both pregnancies.   Even prior to his appendectomy, dairy and alcohol in the same meal would result in abdominal pain and vomiting. Eats little dairy except for cheese. Avoiding trigger foods since her surgery.  Some nausea with eating. Early morning defecation with loose, thin stool. Frequent bloating and feels full. Gut never feels relaxed and normal like it did prior to the appendectomy. Continues to have her baseline 1-2 BM daily. No blood. Possible mucous in the stool. Weight is back to normal after her surgery.  Eating small portions minimizes the bloating. Symptoms improved with defecation. Walking can help her gut feel more normal. She has tried more water intake, minimize alcohol, and avoid caffeine. Did not tolerate a trial of psyllium  due to constipation because it escalated her symptoms.  Dr. Valentino Saxon thought her symptoms may be due to adhesions. No other associated symptoms. No identified exacerbating or relieving features.   She is most  concerned about not knowing what is going on. Mother is suspicious of IBS.   A CT of the abdomen pelvis with  contrast 11/15/2018 showed no evidence of a hernia or other explanation for the reported abdominal wall bulge. The CT also showed pelvic varices that were also identified on her CT scan prior to surgery in September.   The CT was otherwise normal.  Mother has multiple GI issues: IBS diagnosed in her early 40s. Maternal grandfather with colon cancer in his 69s requiring a colostomy. Paternal grandmother required partial gastrectomy for possible cancer. No other known family history of colon cancer or polyps. No family history of uterine/endometrial cancer or pancreatic cancer. No family history of autoimmune disease.   Past Medical History:  Diagnosis Date  . Anemia    during pregnancy  . Asthma    no current problems  . Depression    postpartum depression  . Postpartum care following cesarean delivery (8/14) 02/02/2016  . Scoliosis   . Vacuum extractor delivery, delivered (12/12) 06/01/2013    Past Surgical History:  Procedure Laterality Date  . BREAST ENHANCEMENT SURGERY    . CESAREAN SECTION N/A 02/01/2016   Procedure: Primary CESAREAN SECTION;  Surgeon: Aloha Gell, MD;  Location: [place] [facility];  Service: Obstetrics;  Laterality: N/A;  EDD: 02/08/16 *Time ok per Lenon Curt*  . IR CATHETER TUBE CHANGE  03/22/2018  . IR RADIOLOGIST EVAL & MGMT  03/20/2018  . IR RADIOLOGIST EVAL & MGMT  04/04/2018  . LAPAROSCOPIC ABDOMINAL EXPLORATION N/A 03/06/2018   Procedure: LAPAROSCOPIC PELVIC ABSCESS DRAINAGE;  Surgeon: Erroll Luna, MD;  Location: [facility];  Service: General;  Laterality: N/A;  . LAPAROSCOPY N/A 04/17/2018   Procedure: LAPAROSCOPIC HAND ASSISTED  DRAINAGE OF ABDOMINAL ABSCESS WITH REMOVAL OF APPENDICOLITH;  Surgeon: Erroll Luna, MD;  Location: [facility];  Service: General;  Laterality: N/A;  . SPINAL GROWTH RODS  2000, 2003  . WISDOM TOOTH EXTRACTION      Current Outpatient Medications  Medication Sig Dispense Refill  . acetaminophen (TYLENOL) 500 MG tablet Take 1,000 mg by  mouth every 6 (six) hours as needed for moderate pain or fever.    Marland Kitchen albuterol (VENTOLIN HFA) 108 (90 Base) MCG/ACT inhaler INHALE 2 INHALATIONS EVERY 4 TO 6 HOURS AS NEEDED    . ibuprofen (ADVIL,MOTRIN) 200 MG tablet Take 800 mg by mouth every 8 (eight) hours as needed (pain).    Marland Kitchen loratadine (CLARITIN) 10 MG tablet Take 10 mg by mouth daily as needed for allergies.     . montelukast (SINGULAIR) 10 MG tablet Take 10 mg by mouth at bedtime.     No current facility-administered medications for this visit.     Allergies as of 11/21/2018 - Review Complete 11/21/2018  Allergen Reaction Noted  . Morphine and related Itching and Swelling 01/20/2016    Family History  Problem Relation Age of Onset  . Hypertension Mother   . Hypertension Father   . Diabetes Maternal Grandfather   . Cancer Maternal Grandfather        prostate  . Colon cancer Maternal Grandfather   . Cancer Paternal Grandmother        breast  . Breast cancer Paternal Grandmother     Social History   Socioeconomic History  . Marital status: Married    Spouse name: Not on file  . Number of children: Not on file  . Years of education: Not on file  . Highest education level: Not on file  Occupational History  .  Not on file  Social Needs  . Financial resource strain: Not on file  . Food insecurity:    Worry: Not on file    Inability: Not on file  . Transportation needs:    Medical: Not on file    Non-medical: Not on file  Tobacco Use  . Smoking status: Never Smoker  . Smokeless tobacco: Never Used  Substance and Sexual Activity  . Alcohol use: Yes    Alcohol/week: 2.0 standard drinks    Types: 2 Shots of liquor per week  . Drug use: No  . Sexual activity: Yes    Birth control/protection: Pill  Lifestyle  . Physical activity:    Days per week: Not on file    Minutes per session: Not on file  . Stress: Not on file  Relationships  . Social connections:    Talks on phone: Not on file    Gets together: Not  on file    Attends religious service: Not on file    Active member of club or organization: Not on file    Attends meetings of clubs or organizations: Not on file    Relationship status: Not on file  . Intimate partner violence:    Fear of current or ex partner: Not on file    Emotionally abused: Not on file    Physically abused: Not on file    Forced sexual activity: Not on file  Other Topics Concern  . Not on file  Social History Narrative  . Not on file    Review of Systems: ALL ROS discussed and all others negative except listed in HPI.  Physical Exam: General: in no acute distress Neuro: Alert and appropriate Psych: Normal affect and normal insight   Yancy Hascall L. Tarri Glenn, MD, MPH [facility] 11/21/2018, 11:36 AM

## 2018-11-21 ENCOUNTER — Other Ambulatory Visit: Payer: Self-pay

## 2018-11-21 ENCOUNTER — Ambulatory Visit (INDEPENDENT_AMBULATORY_CARE_PROVIDER_SITE_OTHER): Payer: 59 | Admitting: Gastroenterology

## 2018-11-21 ENCOUNTER — Encounter: Payer: Self-pay | Admitting: Gastroenterology

## 2018-11-21 VITALS — Ht 60.0 in | Wt 90.0 lb

## 2018-11-21 DIAGNOSIS — Z8619 Personal history of other infectious and parasitic diseases: Secondary | ICD-10-CM

## 2018-11-21 DIAGNOSIS — K219 Gastro-esophageal reflux disease without esophagitis: Secondary | ICD-10-CM | POA: Diagnosis not present

## 2018-11-21 DIAGNOSIS — R194 Change in bowel habit: Secondary | ICD-10-CM

## 2018-11-21 NOTE — Patient Instructions (Addendum)
Avoid caffeinated and carbonated foods and beverages.  I have recommended a trial of IBGuard for at least 2 weeks and/or a trial of  probiotics (Align discussed in particular) for at least 2 weeks to see if we can improve your symptoms. These are both over the counter medications. (sometimes you can find coupons online!)  I have recommended some labs and stool studies to look for inflammation and other possible causes of your symptoms.  Go to the basement level of our building for lab work anytime Mon-Fri 8am-4 pm. You do not have to fast. Press "B" on the elevator. The lab is located at the first door on the left as you exit the elevator.   We will ask Dr. Algie Coffer about the clinical significance of pelvic varices seen on CT scans.  I would like for [place] to have another appointment in 4-8 weeks. Please let me know if you have any questions or concerns in the meantime.

## 2018-11-22 ENCOUNTER — Other Ambulatory Visit (INDEPENDENT_AMBULATORY_CARE_PROVIDER_SITE_OTHER): Payer: 59

## 2018-11-22 DIAGNOSIS — K219 Gastro-esophageal reflux disease without esophagitis: Secondary | ICD-10-CM

## 2018-11-22 DIAGNOSIS — Z8619 Personal history of other infectious and parasitic diseases: Secondary | ICD-10-CM | POA: Diagnosis not present

## 2018-11-22 DIAGNOSIS — R194 Change in bowel habit: Secondary | ICD-10-CM

## 2018-11-22 LAB — COMPREHENSIVE METABOLIC PANEL
ALT: 11 U/L (ref 0–35)
AST: 16 U/L (ref 0–37)
Albumin: 4.2 g/dL (ref 3.5–5.2)
Alkaline Phosphatase: 64 U/L (ref 39–117)
BUN: 9 mg/dL (ref 6–23)
CO2: 26 mEq/L (ref 19–32)
Calcium: 8.9 mg/dL (ref 8.4–10.5)
Chloride: 105 mEq/L (ref 96–112)
Creatinine, Ser: 0.83 mg/dL (ref 0.40–1.20)
GFR: 79.36 mL/min (ref >60.00)
Glucose, Bld: 137 mg/dL — ABNORMAL HIGH (ref 70–99)
Potassium: 4 mEq/L (ref 3.5–5.1)
Sodium: 138 mEq/L (ref 135–145)
Total Bilirubin: 0.5 mg/dL (ref 0.2–1.2)
Total Protein: 6.8 g/dL (ref 6.0–8.3)

## 2018-11-22 LAB — CBC WITH DIFFERENTIAL/PLATELET
Basophils Absolute: 0 10*3/uL (ref 0.0–0.1)
Basophils Relative: 0.4 % (ref 0.0–3.0)
Eosinophils Absolute: 0 10*3/uL (ref 0.0–0.7)
Eosinophils Relative: 0.2 % (ref 0.0–5.0)
HCT: 37.4 % (ref 36.0–46.0)
Hemoglobin: 12.9 g/dL (ref 12.0–15.0)
Lymphocytes Relative: 21.6 % (ref 12.0–46.0)
Lymphs Abs: 1.4 10*3/uL (ref 0.7–4.0)
MCHC: 34.6 g/dL (ref 30.0–36.0)
MCV: 88.3 fl (ref 78.0–100.0)
Monocytes Absolute: 0.4 10*3/uL (ref 0.1–1.0)
Monocytes Relative: 5.8 % (ref 3.0–12.0)
Neutro Abs: 4.8 10*3/uL (ref 1.4–7.7)
Neutrophils Relative %: 72 % (ref 43.0–77.0)
Platelets: 272 10*3/uL (ref 150.0–400.0)
RBC: 4.24 Mil/uL (ref 3.87–5.11)
RDW: 12.4 % (ref 11.5–15.5)
WBC: 6.6 10*3/uL (ref 4.0–10.5)

## 2018-11-22 LAB — TSH: TSH: 1.77 u[IU]/mL (ref 0.35–4.50)

## 2018-11-22 LAB — SEDIMENTATION RATE: Sed Rate: 2 mm/hr (ref 0–20)

## 2018-11-22 LAB — C-REACTIVE PROTEIN: CRP: <1 mg/dL (ref 0.5–20.0)

## 2018-11-23 ENCOUNTER — Other Ambulatory Visit: Payer: 59

## 2018-11-23 DIAGNOSIS — K219 Gastro-esophageal reflux disease without esophagitis: Secondary | ICD-10-CM

## 2018-11-23 DIAGNOSIS — Z8619 Personal history of other infectious and parasitic diseases: Secondary | ICD-10-CM

## 2018-11-23 DIAGNOSIS — R194 Change in bowel habit: Secondary | ICD-10-CM

## 2018-11-26 LAB — GIARDIA ANTIGEN
MICRO NUMBER:: 541618
RESULT:: NOT DETECTED
SPECIMEN QUALITY:: ADEQUATE

## 2018-12-03 LAB — CALPROTECTIN, FECAL: Calprotectin, Fecal: 75 ug/g (ref 0–120)

## 2019-05-01 ENCOUNTER — Other Ambulatory Visit: Payer: Self-pay

## 2019-05-01 DIAGNOSIS — Z20822 Contact with and (suspected) exposure to covid-19: Secondary | ICD-10-CM

## 2019-05-03 LAB — NOVEL CORONAVIRUS, NAA: SARS-CoV-2, NAA: NOT DETECTED

## 2019-06-03 ENCOUNTER — Other Ambulatory Visit: Payer: Self-pay

## 2019-06-03 DIAGNOSIS — Z20822 Contact with and (suspected) exposure to covid-19: Secondary | ICD-10-CM

## 2019-06-04 LAB — NOVEL CORONAVIRUS, NAA: SARS-CoV-2, NAA: NOT DETECTED

## 2019-06-05 IMAGING — RF DG SINUS / FISTULA TRACT / ABSCESSOGRAM
5 series · 13 of 13 positions shown · non-contrast
Comparison: none

CLINICAL DATA: Appendicitis with abscess

EXAM:
ABSCESS DRAIN CATHETER INJECTION UNDER FLUOROSCOPY
TECHNIQUE: The procedure, risks (including but not limited to bleeding,
infection, organ damage), benefits, and alternatives were explained
to the patient. Questions regarding the procedure were encouraged
and answered. The patient understands and consents to the procedure.

[Series 1: one shot · 0.13mm/px · 1 of 1 slices shown (1 of 3)]
[im 1/1]
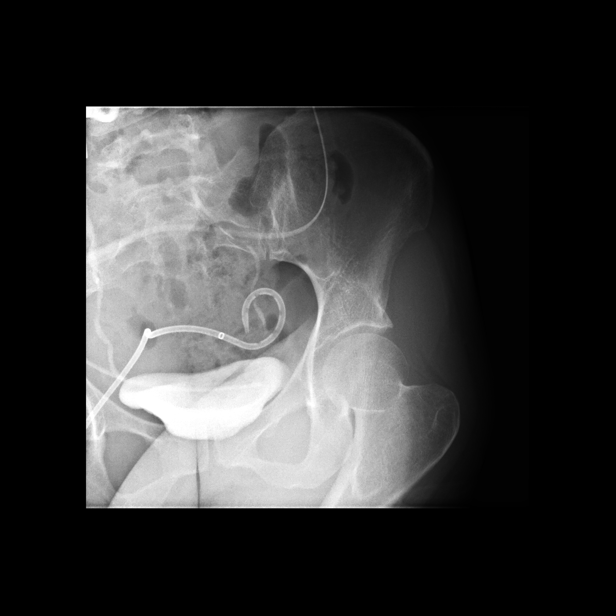

[Series 2: sequence · 4 of 8 frames shown (1 of 2)]
[frame 1/8]
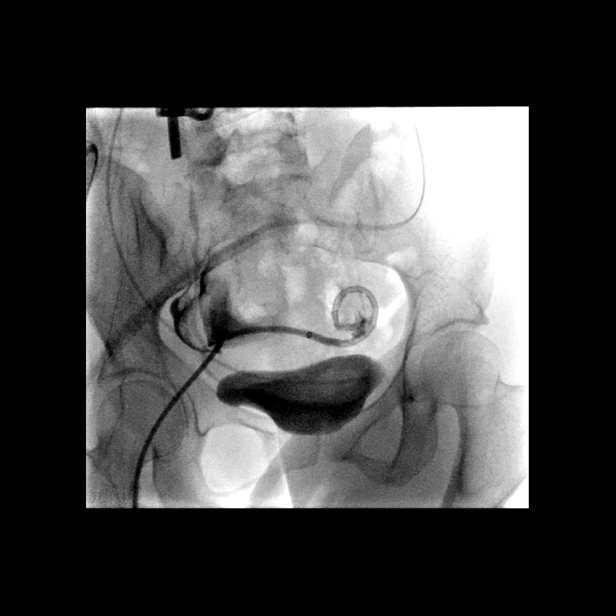
[frame 2/8]
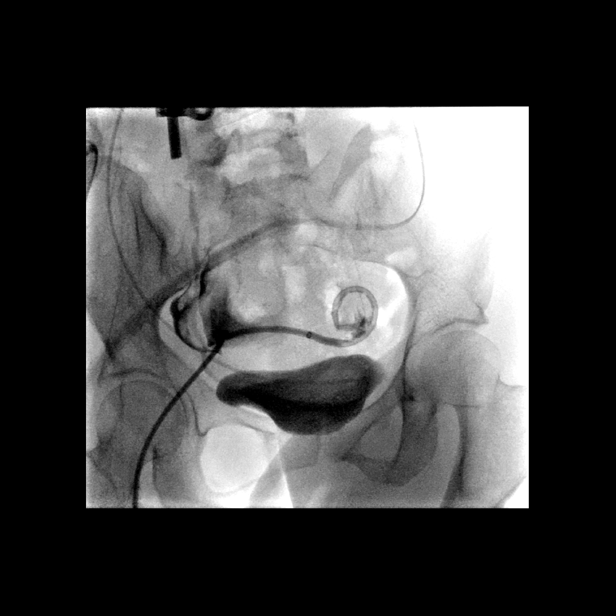
[frame 5/8]
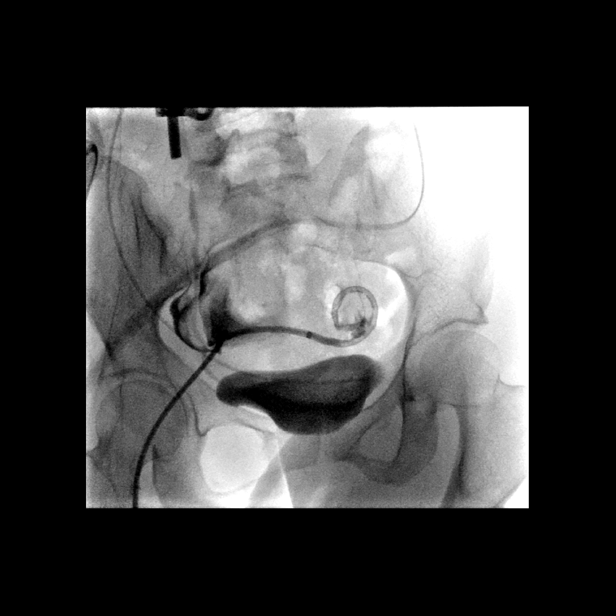
[frame 7/8]
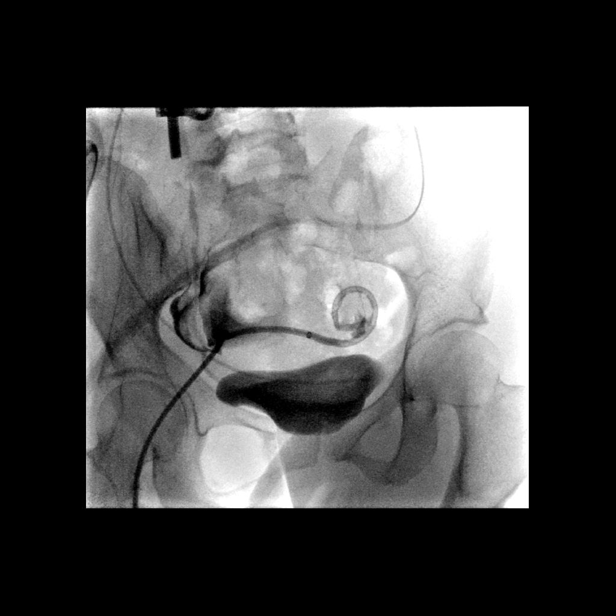

[Series 3: one shot · 0.14mm/px · 2 of 2 slices shown (2 of 3)]
[im 1/2]
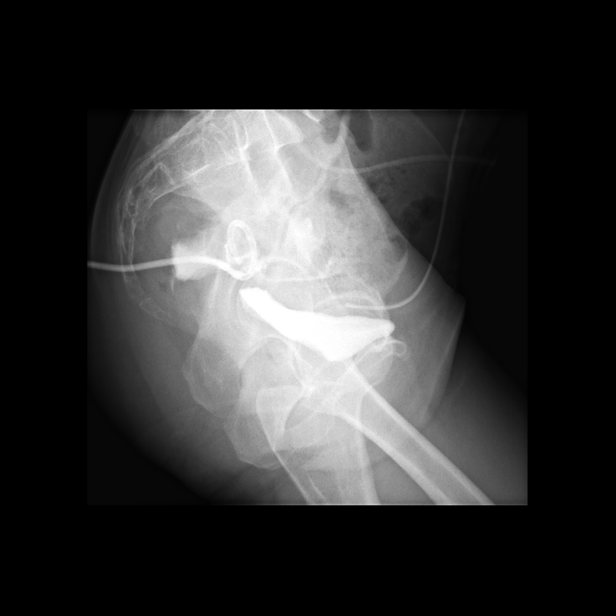
[im 2/2]
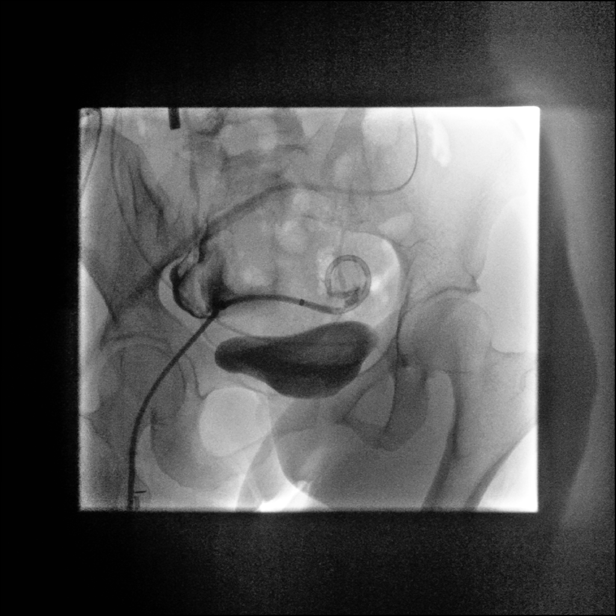

[Series 4: sequence · 3 of 5 frames shown (2 of 2)]
[frame 1/5]
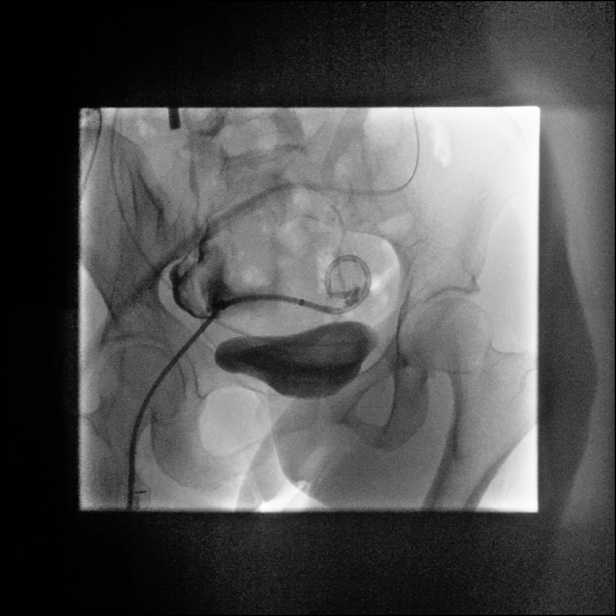
[frame 3/5]
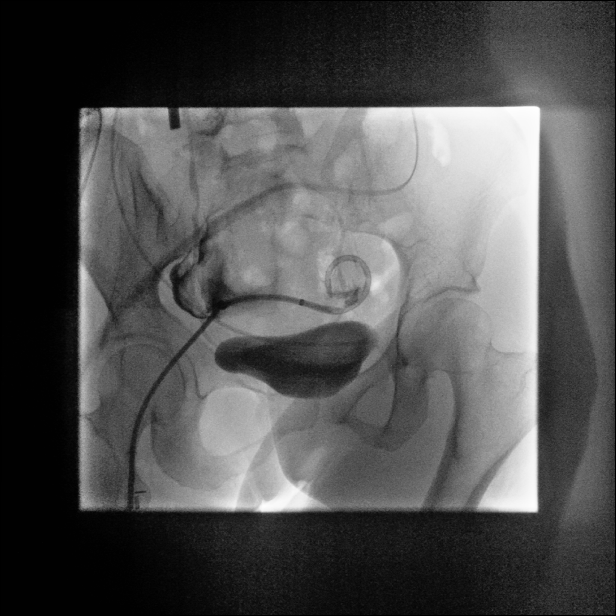
[frame 5/5]
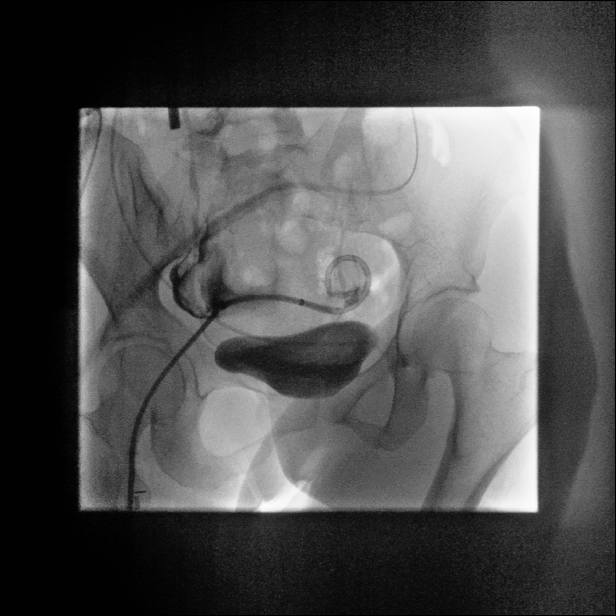

[Series 5: one shot · 3 of 3 slices shown (3 of 3)]
[im 1/3]
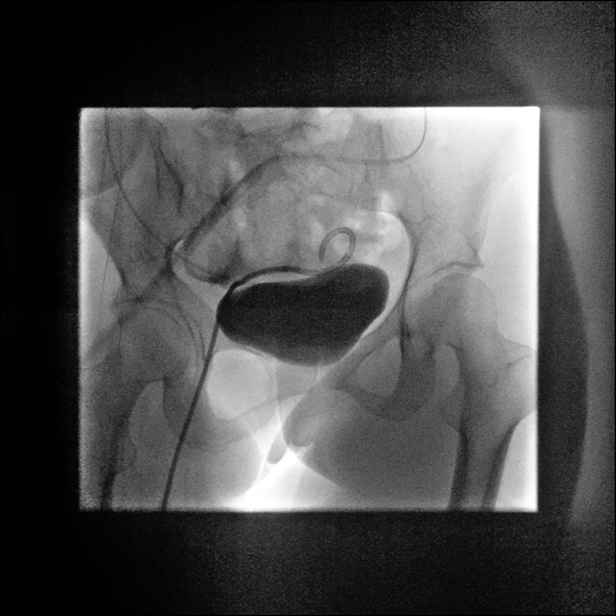
[im 2/3]
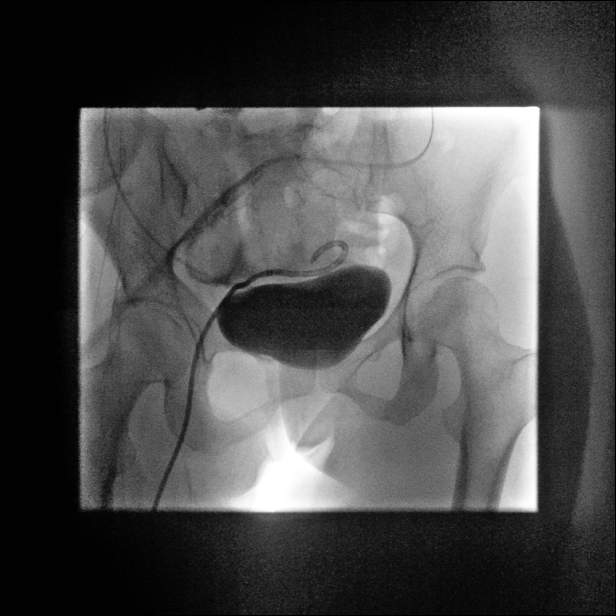
[im 3/3]
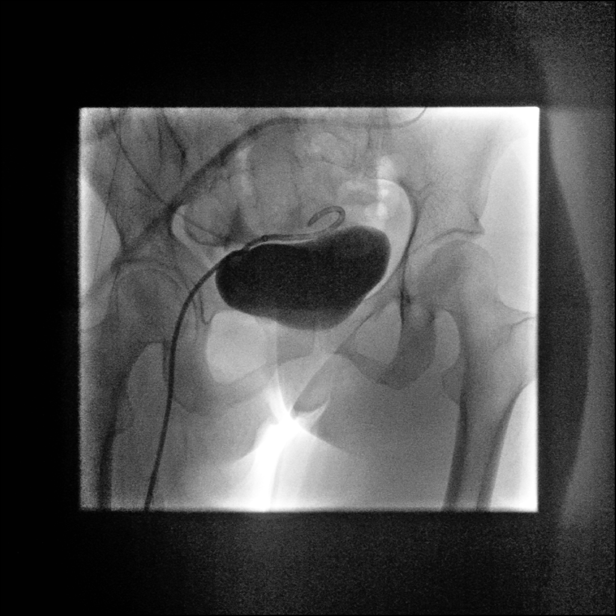

[13 of 13 positions shown; findings below may reference images not displayed]

Survey fluoroscopic inspection reveals stable position of left trans
gluteal drain catheter. Residual contrast material in the nondilated
urinary bladder and distal ureters..

Injection demonstrates no significant abscess cavity around the
pigtail portion of the drain catheter. Contrast tracks back along
the shaft of the catheter into a cul-de-sac collection which is
incompletely opacified.
IMPRESSION: 1. No significant residual abscess cavity around the pigtail portion
of the drain catheter. There is residual extraluminal collection
along the shaft of the catheter. The catheter could not be
repositioned easily under fluoroscopy. The patient will be scheduled
for catheter exchange and repositioning into this dominant residual
component.

## 2019-06-05 IMAGING — CT CT ABD-PELV W/ CM
2 of 4 series · 12 of 46 positions shown, 14 images · IV contrast (iopamidol)
Comparison: 03/05/2018

CLINICAL DATA: Ruptured appendicitis with abscess x 0Drains places
[DATE] and [DATE]Hx c-section and laproscopic to clean abscessNo caPrior
in pacsFlushing 2 times a dayBack drain - draining 30ml or less
dailyFront drain - draining 30-60ml dailyCurrently on abx

EXAM:
CT ABDOMEN AND PELVIS WITH CONTRAST
TECHNIQUE: Multidetector CT imaging of the abdomen and pelvis was performed
using the standard protocol following bolus administration of
intravenous contrast.
CONTRAST:  100mL [id number] IOPAMIDOL ([id number]) INJECTION 61%

[Series 2: abd pelvis 5.00 br40 s3 ax · axial · 0.44mm/px · z∈[+1217,+1532]mm · 9 of 77 slices shown, 11 images]
[im 7/77  soft-tissue]
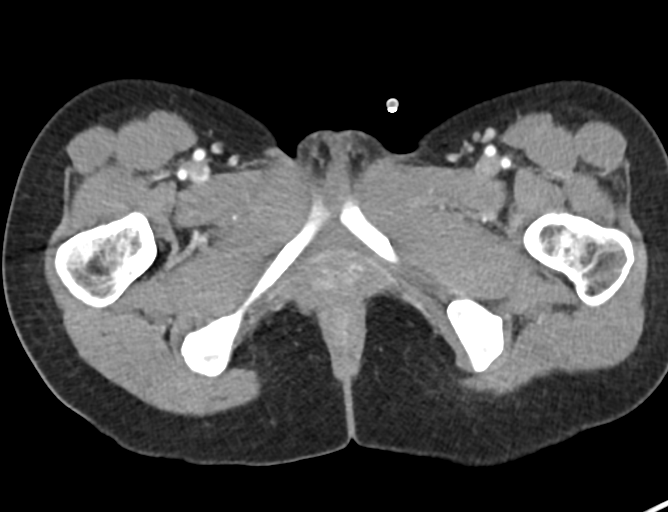
[im 7/77  bone]
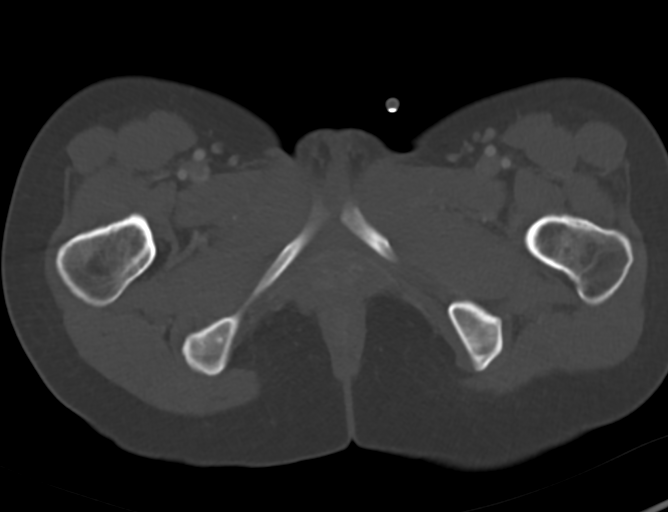
[im 16/77  soft-tissue]
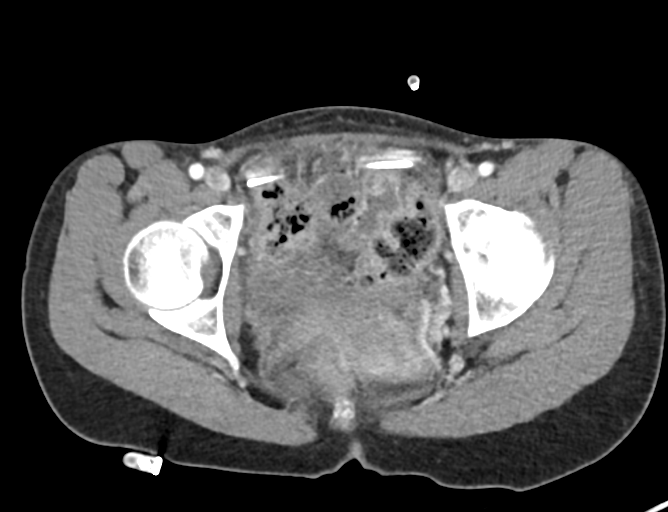
[im 22/77  soft-tissue]
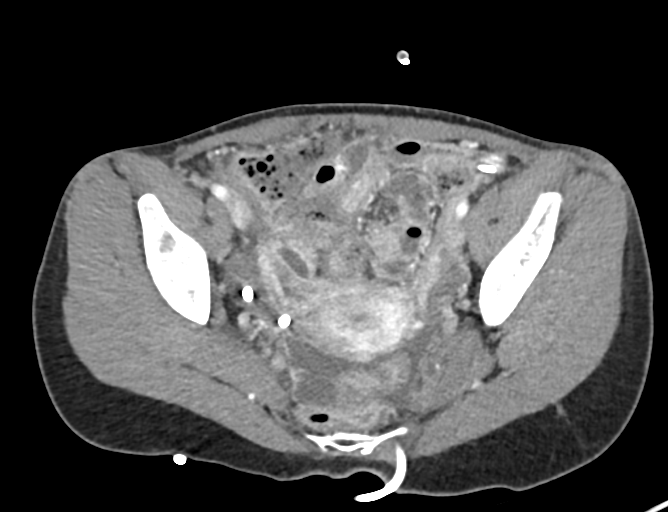
[im 31/77  soft-tissue]
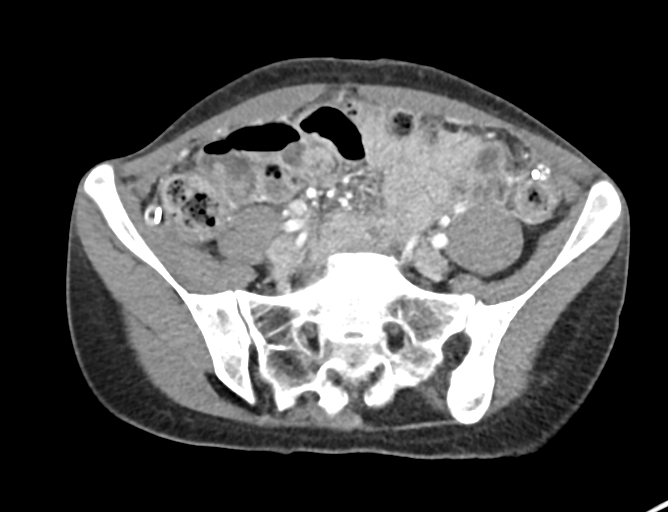
[im 40/77  soft-tissue]
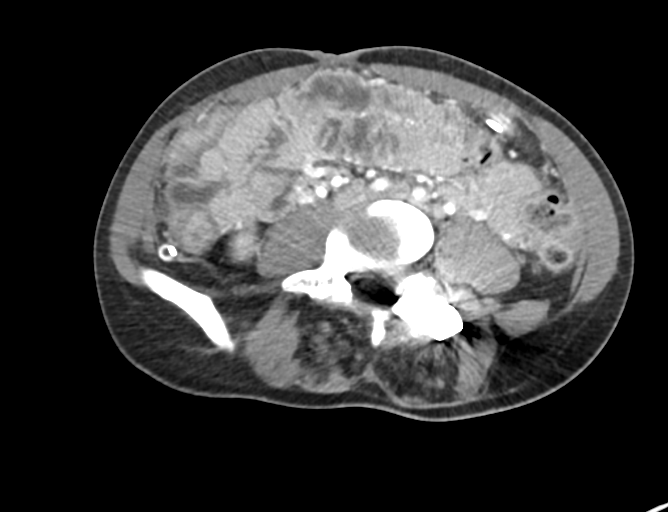
[im 46/77  soft-tissue]
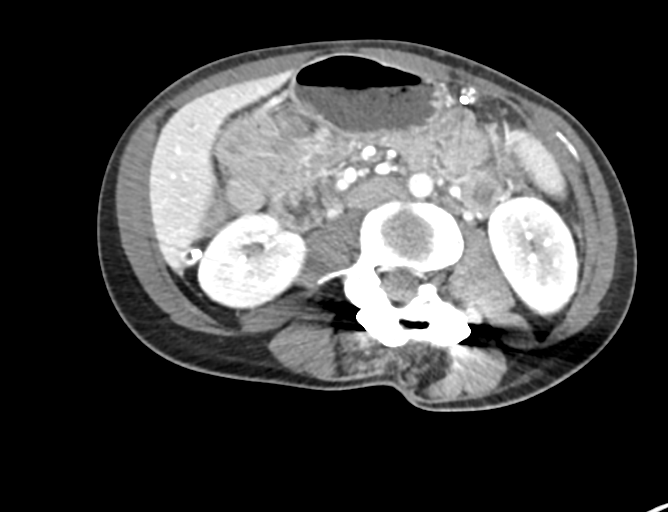
[im 55/77  soft-tissue]
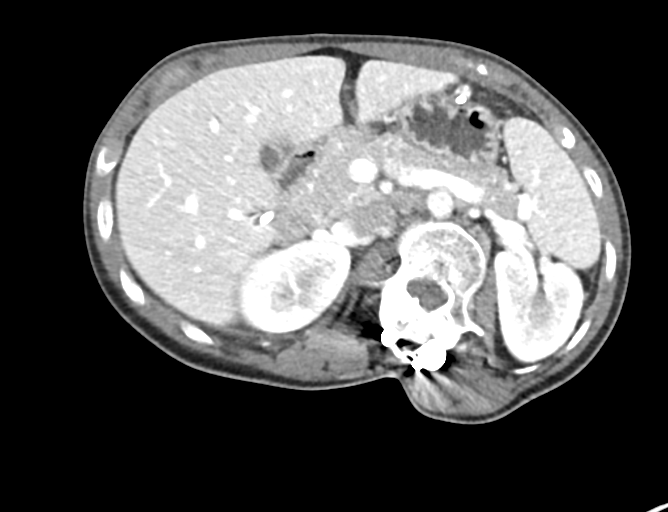
[im 64/77  soft-tissue]
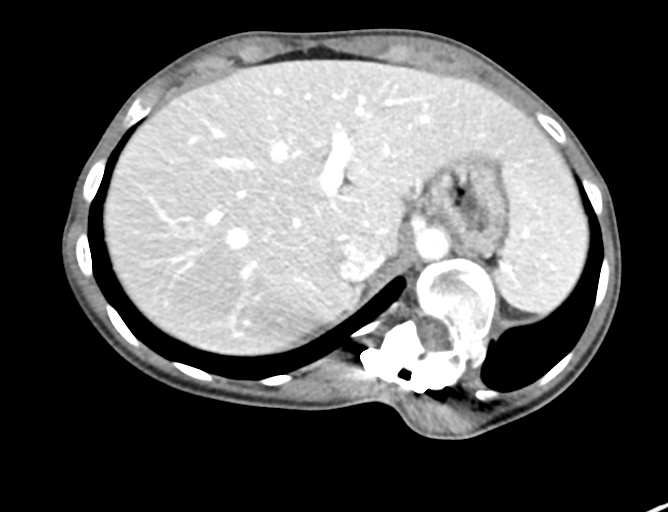
[im 70/77  soft-tissue]
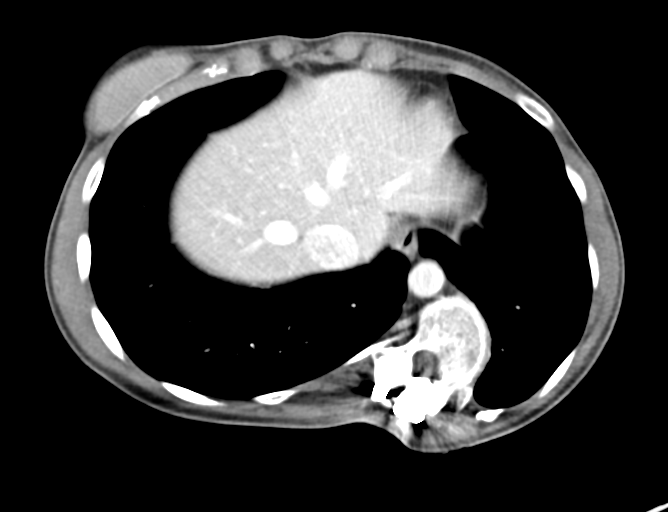
[im 70/77  bone]
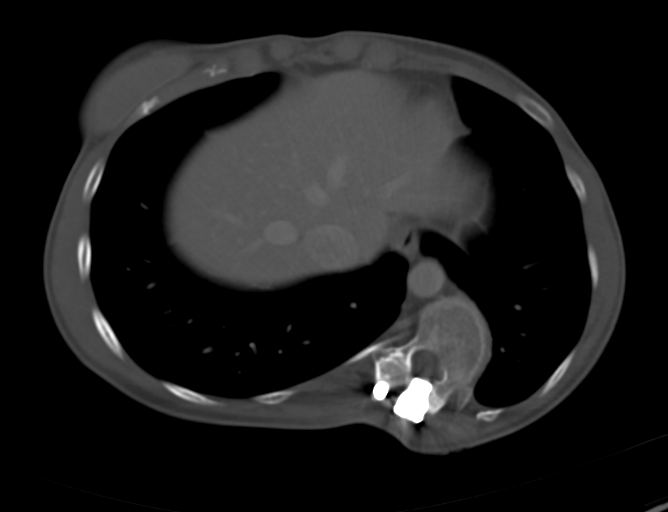

[Series 6: abd pelvis 2.00 br40 s3 cor · coronal · 0.58mm/px · 3 of 113 slices shown]
[im 38/113  soft-tissue]
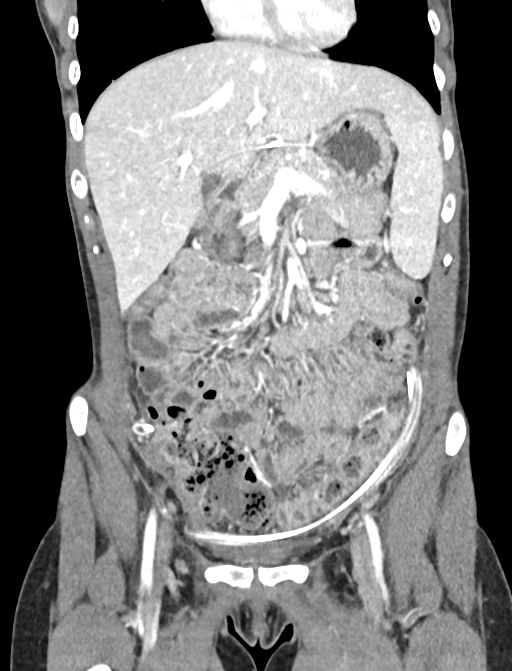
[im 50/113  soft-tissue]
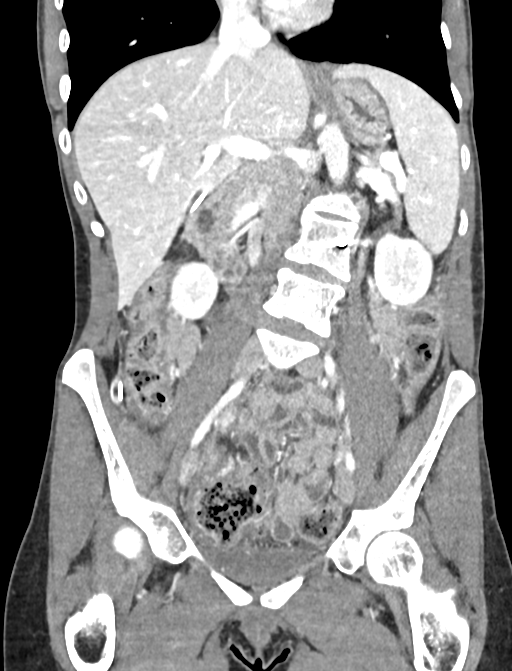
[im 63/113  soft-tissue]
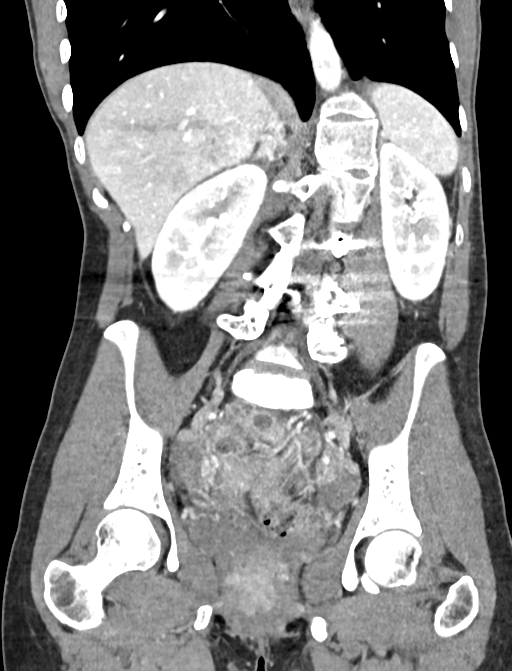

[12 of 46 positions shown; findings below may reference images not displayed]

FINDINGS: Lower chest: No pleural or pericardial effusion. Bilateral breast
prostheses. Visualized lung bases clear.

Hepatobiliary: Stable subcentimeter probable cysts in hepatic
segments 2 and 4B. New 3.1 cm low-attenuation subcapsular process in
segment [DATE], containing a 9 mm hyperdense or enhancing focus. No
biliary ductal dilatation. Gallbladder is nondistended.

Pancreas: Unremarkable. No pancreatic ductal dilatation or
surrounding inflammatory changes.

Spleen: Normal in size without focal abnormality.

Adrenals/Urinary Tract: Normal adrenal glands. Normal kidneys.
Urinary bladder is nondistended.

Stomach/Bowel: Stomach is nondilated. Small bowel decompressed.
Appendix not discretely identified. Moderate fecal material in the
nondilated cecum. The colon is decompressed.

Vascular/Lymphatic: No significant vascular findings are present. No
enlarged abdominal or pelvic lymph nodes.

Reproductive: Uterus and bilateral adnexa are unremarkable.

Other: Surgical drain from right lower quadrant approach around the
periphery of the peritoneal cavity anteriorly, tip in the right
anterior pelvis.

Left transgluteal pigtail drain catheter extends across the midline
into cul-de-sac collection which is significantly decompressed with
a small amount of residual enhancing fluid bilaterally, measured up
to 3.9 cm axial diameter.

Musculoskeletal: Thoracolumbar levoscoliosis with posterior fixation
and fusion hardware extending down to L4 posterior elements. No
fracture or worrisome bone lesion.
IMPRESSION: 1. Significant interval improvement in cul-de-sac abscess post
percutaneous drainage. There is a small amount of residual enhancing
fluid posteriorly.
2. Resolution of right lower quadrant anterior peritoneal abscess
with placement surgical drain.
3. New 3.1 cm subcapsular hepatic lesion in the posterior right
right lobe, suspect abscess.

## 2019-06-17 ENCOUNTER — Ambulatory Visit: Payer: Managed Care, Other (non HMO) | Attending: Internal Medicine

## 2019-06-17 DIAGNOSIS — Z20822 Contact with and (suspected) exposure to covid-19: Secondary | ICD-10-CM

## 2019-06-19 LAB — NOVEL CORONAVIRUS, NAA: SARS-CoV-2, NAA: NOT DETECTED
